# Patient Record
Sex: Female | Born: 1960 | Race: White | Hispanic: No | Marital: Married | Smoking: Former smoker
Health system: Southern US, Community
[De-identification: ages and names within clinical notes are randomized; demographics above are authoritative.]

## PROBLEM LIST (undated history)

## (undated) DIAGNOSIS — E079 Disorder of thyroid, unspecified: Secondary | ICD-10-CM

## (undated) DIAGNOSIS — Z87891 Personal history of nicotine dependence: Secondary | ICD-10-CM

## (undated) DIAGNOSIS — N959 Unspecified menopausal and perimenopausal disorder: Secondary | ICD-10-CM

## (undated) HISTORY — DX: Disorder of thyroid, unspecified: E07.9

## (undated) HISTORY — PX: TONSILLECTOMY AND ADENOIDECTOMY: SUR1326

## (undated) HISTORY — DX: Personal history of nicotine dependence: Z87.891

## (undated) HISTORY — PX: TUBAL LIGATION: SHX77

## (undated) HISTORY — DX: Unspecified menopausal and perimenopausal disorder: N95.9

## (undated) HISTORY — PX: CHOLECYSTECTOMY: SHX55

---

## 2004-12-23 ENCOUNTER — Ambulatory Visit: Payer: Self-pay | Admitting: Unknown Physician Specialty

## 2007-06-30 ENCOUNTER — Ambulatory Visit: Payer: Self-pay | Admitting: Emergency Medicine

## 2007-06-30 ENCOUNTER — Other Ambulatory Visit: Payer: Self-pay

## 2009-01-14 ENCOUNTER — Ambulatory Visit: Payer: Self-pay | Admitting: Endocrinology

## 2009-08-24 ENCOUNTER — Ambulatory Visit: Payer: Self-pay | Admitting: Unknown Physician Specialty

## 2011-07-31 ENCOUNTER — Emergency Department: Payer: Self-pay | Admitting: Emergency Medicine

## 2011-07-31 LAB — CBC
HCT: 41.6 % (ref 35.0–47.0)
MCH: 30.6 pg (ref 26.0–34.0)
MCHC: 33.5 g/dL (ref 32.0–36.0)
MCV: 91 fL (ref 80–100)
Platelet: 218 10*3/uL (ref 150–440)
RBC: 4.56 10*6/uL (ref 3.80–5.20)
RDW: 13.6 % (ref 11.5–14.5)
WBC: 5.4 10*3/uL (ref 3.6–11.0)

## 2011-07-31 LAB — CK TOTAL AND CKMB (NOT AT ARMC)
CK, Total: 40 U/L (ref 21–215)
CK-MB: <0.5 ng/mL — ABNORMAL LOW (ref 0.5–3.6)

## 2011-07-31 LAB — COMPREHENSIVE METABOLIC PANEL
Albumin: 3.9 g/dL (ref 3.4–5.0)
Alkaline Phosphatase: 67 U/L (ref 50–136)
BUN: 9 mg/dL (ref 7–18)
Bilirubin,Total: 0.4 mg/dL (ref 0.2–1.0)
Calcium, Total: 9 mg/dL (ref 8.5–10.1)
Co2: 27 mmol/L (ref 21–32)
Glucose: 98 mg/dL (ref 65–99)
Potassium: 3.7 mmol/L (ref 3.5–5.1)
SGOT(AST): 24 U/L (ref 15–37)
SGPT (ALT): 23 U/L
Sodium: 142 mmol/L (ref 136–145)
Total Protein: 7.3 g/dL (ref 6.4–8.2)

## 2011-07-31 LAB — TSH: Thyroid Stimulating Horm: 2.93 u[IU]/mL

## 2011-08-14 ENCOUNTER — Ambulatory Visit (INDEPENDENT_AMBULATORY_CARE_PROVIDER_SITE_OTHER): Payer: BC Managed Care – PPO | Admitting: Cardiovascular Disease

## 2011-08-14 ENCOUNTER — Encounter: Payer: Self-pay | Admitting: Cardiovascular Disease

## 2011-08-14 VITALS — BP 120/80 | HR 76 | Resp 16 | Ht 64.0 in | Wt 147.0 lb

## 2011-08-14 DIAGNOSIS — R42 Dizziness and giddiness: Secondary | ICD-10-CM | POA: Insufficient documentation

## 2011-08-14 NOTE — Assessment & Plan Note (Signed)
Etiology of the episodes of lightheadedness is uncertain. She does appreciate palpitations/tachycardia. We have ordered a Holter/event monitor to rule out underlying arrhythmia. We have asked her to monitor her blood pressure closely. Unable to exclude brief periods of orthostatic hypotension. His blood pressure is low, we have encouraged her to increase her salt and fluid intake. Lab work from the hospital did not suggest low blood glucose.   She would like to workout at the gym and we have suggested if her symptoms get worse with exertion, that she call us for a treadmill study to rule out ischemia.

## 2011-08-14 NOTE — Patient Instructions (Signed)
You are doing well. No medication changes were made. We have ordered a holter/event monitor to evaluate your lightheaded episodes.  Please call us if you have new issues that need to be addressed before your next appt.  Your physician wants you to follow-up in: 1 months.  We will call you with the results.

## 2011-08-14 NOTE — Progress Notes (Signed)
   Patient ID: Brittany Glover, female    DOB: Dec 14, 1960, 51 y.o.   MRN: [id number]  HPI Comments: Brittany Glover is a very pleasant 51 year old woman with a history of premenopausal symptoms, vitamin D deficiency, remote smoking history for 25 years one pack per day, who presents for evaluation of recent episodes of lightheadedness.   She reports that approximately 2 months ago she started having symptoms of lightheadedness while sitting and standing. No symptoms when lying flat. She reports having rapid palpitations when she felt her pulse in her neck during these episodes. Symptoms lasted several seconds, less than 1 minute. She had one severe episode after getting out of the shower with lightheadedness and nausea. She has had several episodes per week. She reports that it feels like her sugars are dropping low if symptoms do not seem to improve with eating. She has been gaining weight recently and would like to go to the gym.   She sleeps poorly on a regular basis. She is able to get to sleep and not able to stay asleep No new medications over the past several months.  She went to the hospital over 25th which showed normal EKG with no significant ST or T wave changes.  Normal lab work including cardiac enzymes, TSH, CBC, BMP and LFTs   Outpatient Encounter Prescriptions as of 08/14/2011  Medication Sig Dispense Refill  . cholecalciferol (VITAMIN D) 1000 UNITS tablet Take 2,000 Units by mouth daily.      . Multiple Vitamin (MULITIVITAMIN WITH MINERALS) TABS Take 1 tablet by mouth daily.      . progesterone (PROMETRIUM) 100 MG capsule Take 150 mg by mouth daily.        Review of Systems  Constitutional: Negative.   HENT: Negative.   Eyes: Negative.   Respiratory: Negative.   Cardiovascular: Negative.   Gastrointestinal: Negative.   Musculoskeletal: Negative.   Skin: Negative.   Neurological: Positive for dizziness and light-headedness.  Hematological: Negative.   Psychiatric/Behavioral:  Negative.   All other systems reviewed and are negative.    BP 120/80  Pulse 76  Resp 16  Wt 147 lb (66.679 kg)  Physical Exam  Nursing note and vitals reviewed. Constitutional: She is oriented to person, place, and time. She appears well-developed and well-nourished.  HENT:  Head: Normocephalic.  Nose: Nose normal.  Mouth/Throat: Oropharynx is clear and moist.  Eyes: Conjunctivae are normal. Pupils are equal, round, and reactive to light.  Neck: Normal range of motion. Neck supple. No JVD present.  Cardiovascular: Normal rate, regular rhythm, S1 normal, S2 normal, normal heart sounds and intact distal pulses.  Exam reveals no gallop and no friction rub.   No murmur heard. Pulmonary/Chest: Effort normal and breath sounds normal. No respiratory distress. She has no wheezes. She has no rales. She exhibits no tenderness.  Abdominal: Soft. Bowel sounds are normal. She exhibits no distension. There is no tenderness.  Musculoskeletal: Normal range of motion. She exhibits no edema and no tenderness.  Lymphadenopathy:    She has no cervical adenopathy.  Neurological: She is alert and oriented to person, place, and time. Coordination normal.  Skin: Skin is warm and dry. No rash noted. No erythema.  Psychiatric: She has a normal mood and affect. Her behavior is normal. Judgment and thought content normal.         Assessment and Plan

## 2011-09-14 ENCOUNTER — Ambulatory Visit (INDEPENDENT_AMBULATORY_CARE_PROVIDER_SITE_OTHER): Payer: BC Managed Care – PPO | Admitting: Cardiovascular Disease

## 2011-09-14 ENCOUNTER — Encounter: Payer: Self-pay | Admitting: Cardiovascular Disease

## 2011-09-14 VITALS — BP 90/64 | HR 76 | Ht 64.0 in | Wt 146.5 lb

## 2011-09-14 DIAGNOSIS — R42 Dizziness and giddiness: Secondary | ICD-10-CM

## 2011-09-14 NOTE — Patient Instructions (Signed)
You are doing well. No medication changes were made.  Please call us if you have new issues that need to be addressed before your next appt.  Your physician wants you to follow-up in: 12 months.  You will receive a reminder letter in the mail two months in advance. If you don't receive a letter, please call our office to schedule the follow-up appointment. 

## 2011-09-14 NOTE — Assessment & Plan Note (Signed)
She does not believe her symptoms are from low blood pressure, rather she feels her symptoms are from high blood pressure. Most of her blood pressure readings are well controlled. He then monitor does not show arrhythmia. No further workup at this time. She does not need medications for hypertension given most of her numbers are well controlled. We will continue to monitor her symptoms for now.

## 2011-09-14 NOTE — Progress Notes (Signed)
   Patient ID: Brittany Glover, female    DOB: [birth year], 51 y.o.   MRN: [id number]  HPI Comments: Brittany Glover is a very pleasant 51 year old woman with a history of premenopausal symptoms, vitamin D deficiency, remote smoking history for 25 years one pack per day, who presents for followup of recent episodes of lightheadedness.   On her previous office visit, she had symptoms of lightheadedness while sitting and standing. No symptoms when lying flat. She reports having rapid palpitations when she felt her pulse in her neck during these episodes.  The event monitor was ordered. She has worn this almost 4 weeks with no significant arrhythmia noted. She does have long list of her blood pressure and heart rate numbers. Blood pressure is generally well controlled with occasional systolic pressure in the 90s. She reports that she is asymptomatic with systolic pressure in the 90s and she is more worried about one day when she had malaise and dizziness with systolic pressure of 160. This was brief, and rare. She has had no recent episodes of lightheadedness or dizziness. Less palpitations as well.  Normal lab work including cardiac enzymes, TSH, CBC, BMP and LFTs at [facility]    Outpatient Encounter Prescriptions as of 09/14/2011  Medication Sig Dispense Refill  . cholecalciferol (VITAMIN D) 1000 UNITS tablet Take 2,000 Units by mouth daily.      Marland Kitchen levothyroxine (SYNTHROID, LEVOTHROID) 88 MCG tablet Take 88 mcg alternate with 75 mcg on the third day.      . Multiple Vitamin (MULITIVITAMIN WITH MINERALS) TABS Take 1 tablet by mouth daily.      . progesterone (PROMETRIUM) 100 MG capsule Take 150 mg by mouth daily.        Review of Systems  Constitutional: Negative.   HENT: Negative.   Eyes: Negative.   Respiratory: Negative.   Cardiovascular: Negative.   Gastrointestinal: Negative.   Musculoskeletal: Negative.   Skin: Negative.   Hematological: Negative.   Psychiatric/Behavioral: Negative.   All other  systems reviewed and are negative.    BP 90/64  Pulse 76  Ht 5\' 4"  (1.626 m)  Wt 146 lb 8 oz (66.452 kg)  BMI 25.15 kg/m2  Physical Exam  Nursing note and vitals reviewed. Constitutional: She is oriented to person, place, and time. She appears well-developed and well-nourished.  HENT:  Head: Normocephalic.  Nose: Nose normal.  Mouth/Throat: Oropharynx is clear and moist.  Eyes: Conjunctivae are normal. Pupils are equal, round, and reactive to light.  Neck: Normal range of motion. Neck supple. No JVD present.  Cardiovascular: Normal rate, regular rhythm, S1 normal, S2 normal, normal heart sounds and intact distal pulses.  Exam reveals no gallop and no friction rub.   No murmur heard. Pulmonary/Chest: Effort normal and breath sounds normal. No respiratory distress. She has no wheezes. She has no rales. She exhibits no tenderness.  Abdominal: Soft. Bowel sounds are normal. She exhibits no distension. There is no tenderness.  Musculoskeletal: Normal range of motion. She exhibits no edema and no tenderness.  Lymphadenopathy:    She has no cervical adenopathy.  Neurological: She is alert and oriented to person, place, and time. Coordination normal.  Skin: Skin is warm and dry. No rash noted. No erythema.  Psychiatric: She has a normal mood and affect. Her behavior is normal. Judgment and thought content normal.         Assessment and Plan

## 2011-10-04 ENCOUNTER — Encounter: Payer: Self-pay | Admitting: Cardiovascular Disease

## 2011-10-19 ENCOUNTER — Emergency Department: Payer: Self-pay | Admitting: Emergency Medicine

## 2011-10-19 LAB — COMPREHENSIVE METABOLIC PANEL
Albumin: 3.9 g/dL (ref 3.4–5.0)
Alkaline Phosphatase: 73 U/L (ref 50–136)
Anion Gap: 7 (ref 7–16)
Bilirubin,Total: 0.3 mg/dL (ref 0.2–1.0)
Chloride: 108 mmol/L — ABNORMAL HIGH (ref 98–107)
Co2: 27 mmol/L (ref 21–32)
EGFR (African American): >60
Glucose: 96 mg/dL (ref 65–99)
Potassium: 3.8 mmol/L (ref 3.5–5.1)
SGOT(AST): 28 U/L (ref 15–37)
Sodium: 142 mmol/L (ref 136–145)

## 2011-10-19 LAB — TROPONIN I: Troponin-I: <0.02 ng/mL

## 2012-08-22 ENCOUNTER — Ambulatory Visit: Payer: Self-pay

## 2013-05-19 IMAGING — CR DG CHEST 2V
1 series · 2 of 2 positions shown · non-contrast
Comparison: none

REASON FOR EXAM: chest pain.
COMMENTS:

[Series 1: w chest pa · 0.14mm/px · 2 of 2 slices shown]
[im 1/2]
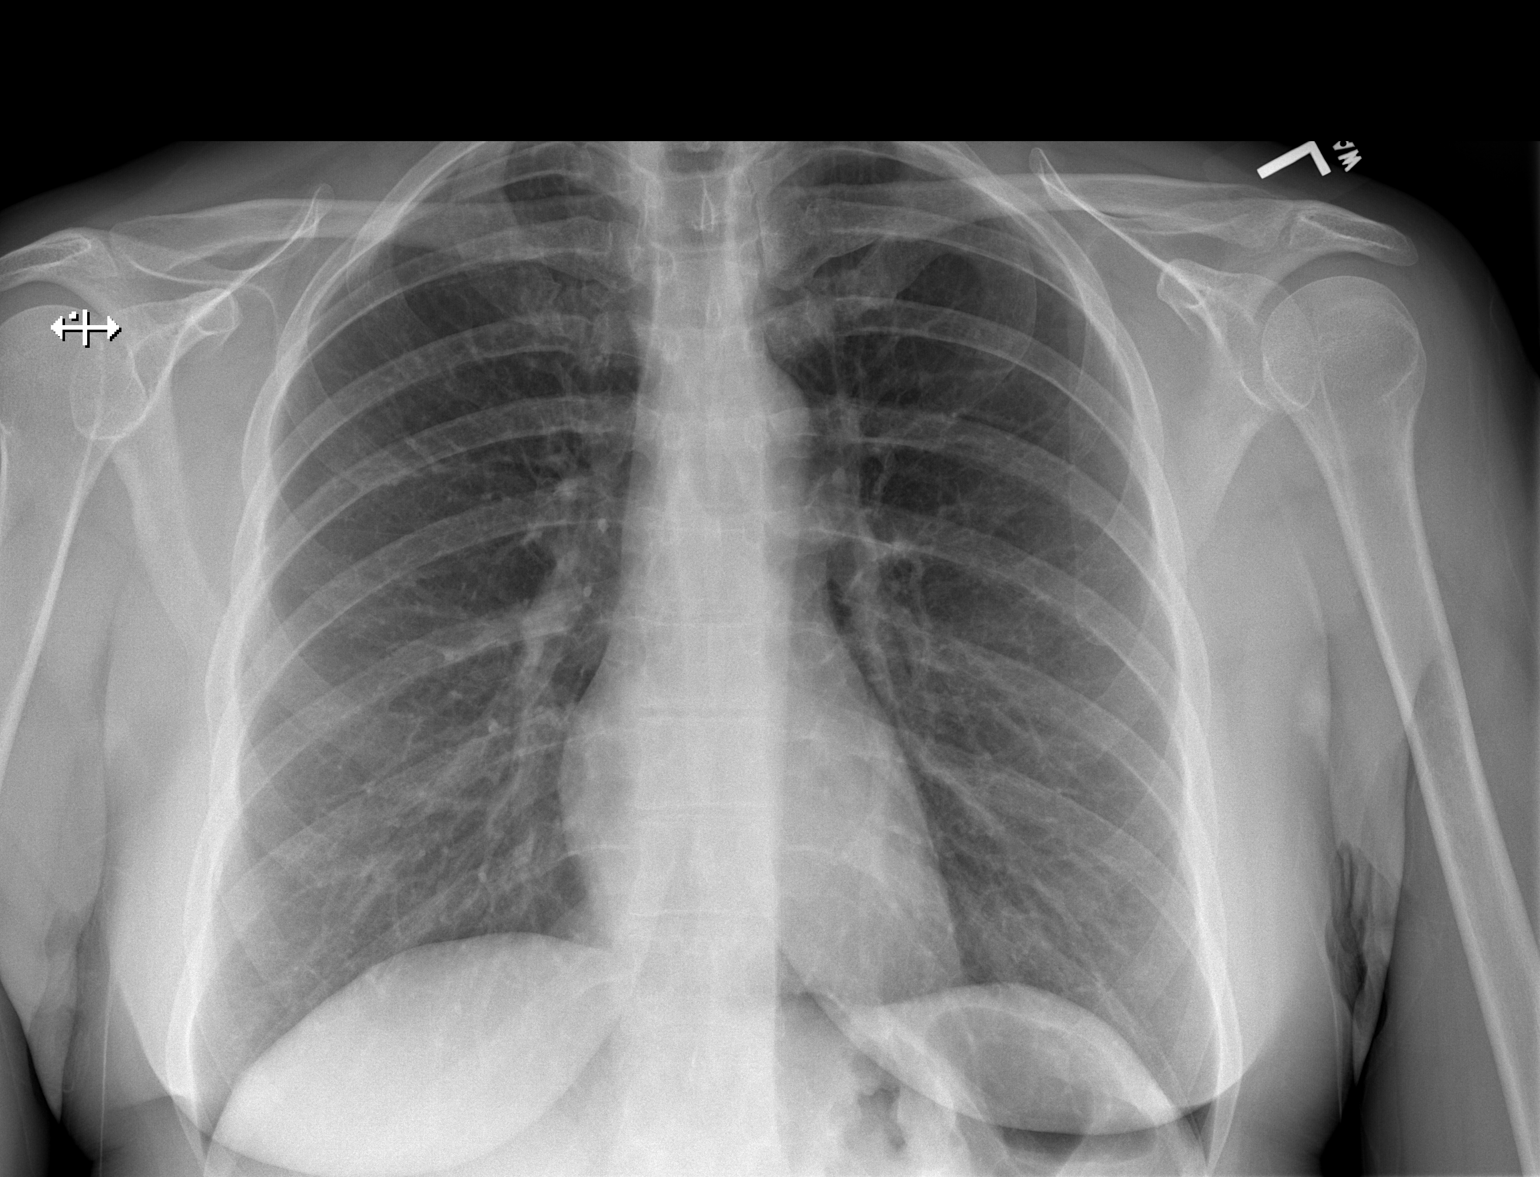
[im 2/2]
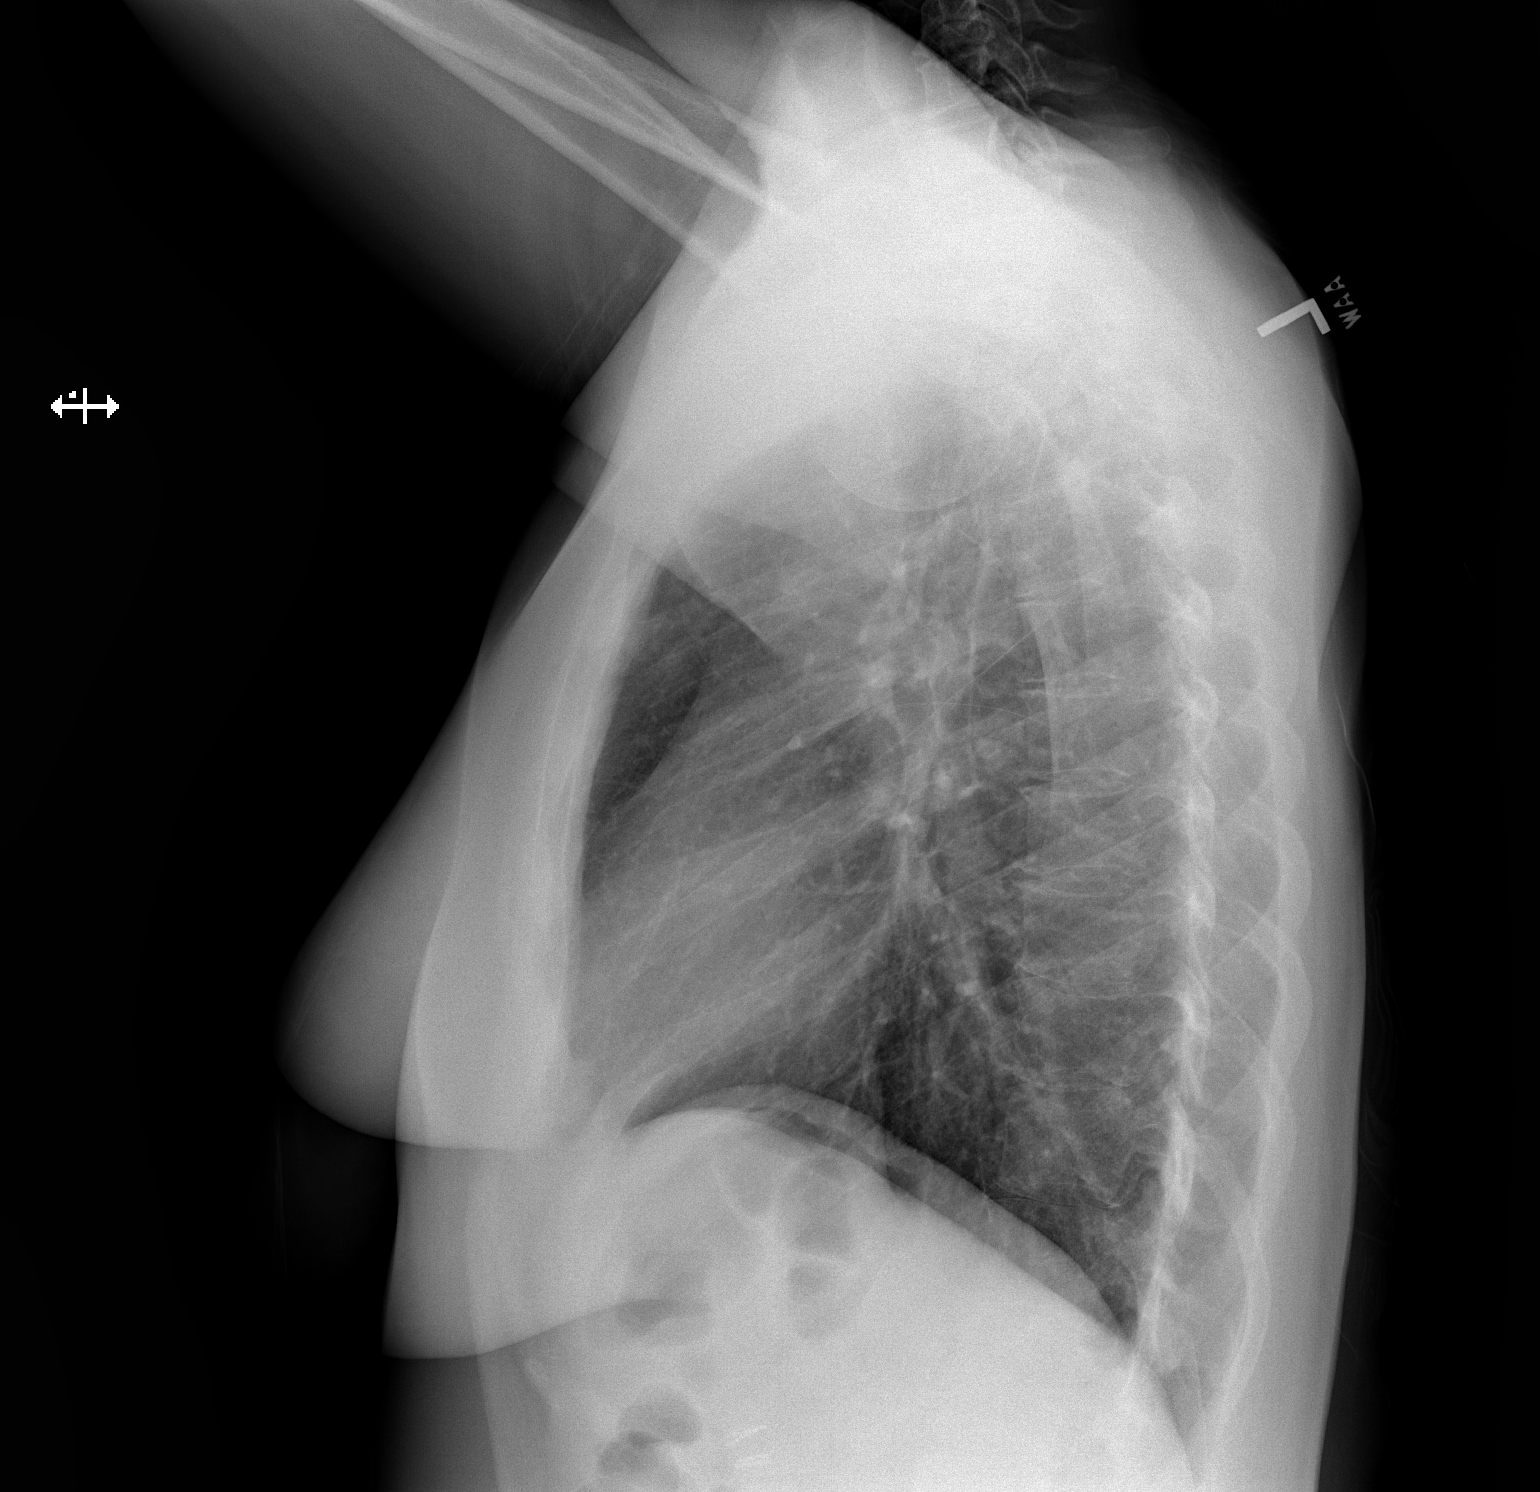

[2 of 2 positions shown; findings below may reference images not displayed]

PROCEDURE:     DXR - DXR CHEST PA (OR AP) AND LATERAL  - October 19, 2011  [DATE]

RESULT:     The lungs are adequately inflated. There is no focal infiltrate.
There is no evidence of pneumothorax or pneumomediastinum or pleural
effusion. The cardiac silhouette is normal in size. The pulmonary
vascularity is not engorged. The bony thorax exhibits no acute abnormality.
IMPRESSION: I do not see evidence of acute cardiopulmonary abnormality.

## 2013-05-19 IMAGING — US US EXTREM LOW VENOUS BILAT
1 series · 14 of 24 positions shown · non-contrast
Comparison: none

REASON FOR EXAM: swelling bil legs
COMMENTS:

[Series 1: us extrem low venous bilat · 0.06mm/px · 14 of 44 slices shown]
[im 1/44]
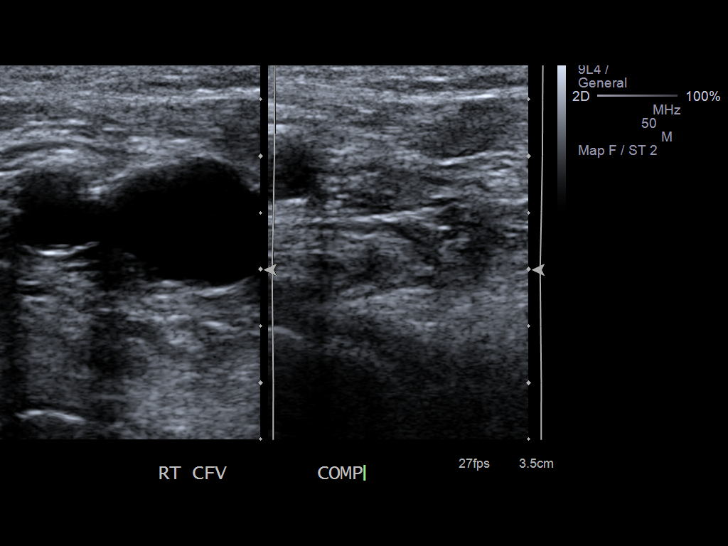
[im 4/44]
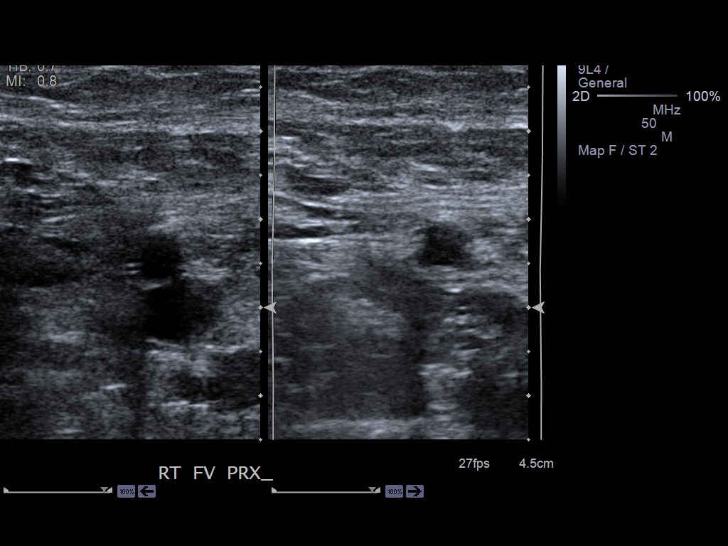
[im 8/44]
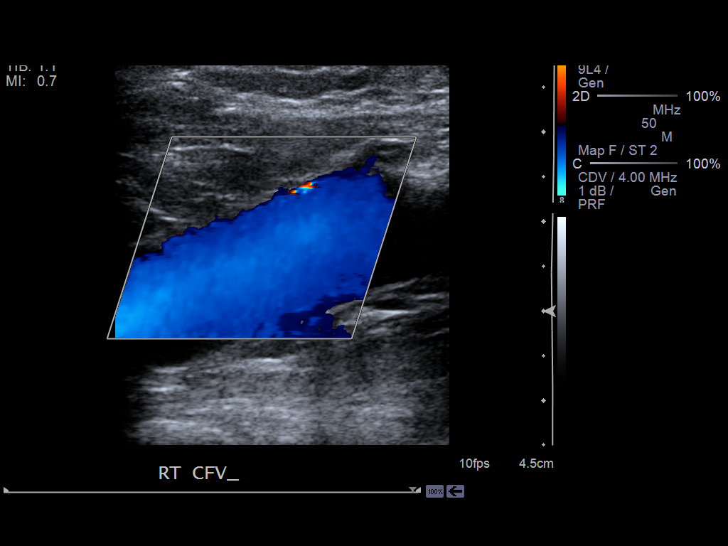
[im 12/44]
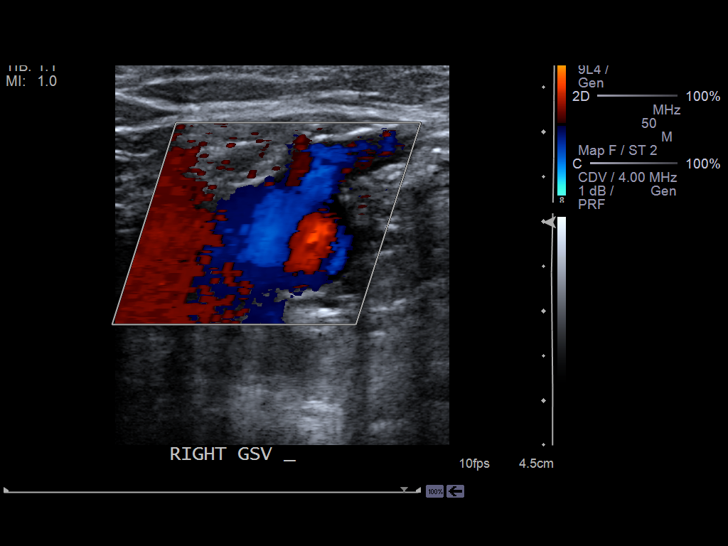
[im 14/44]
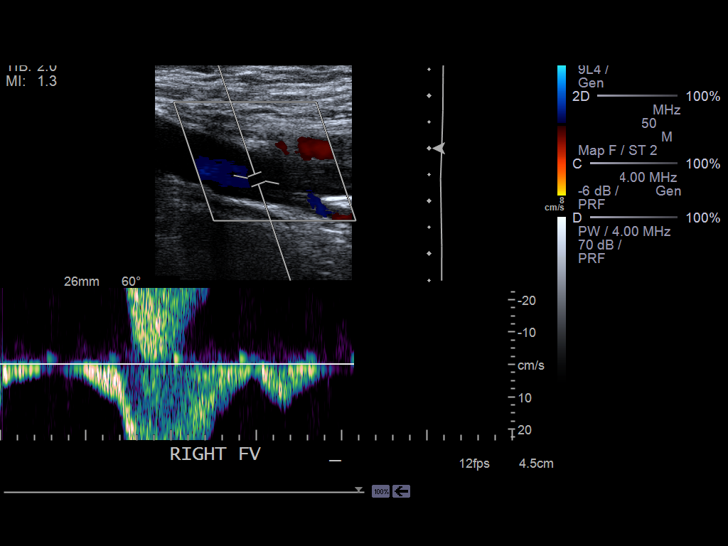
[im 17/44]
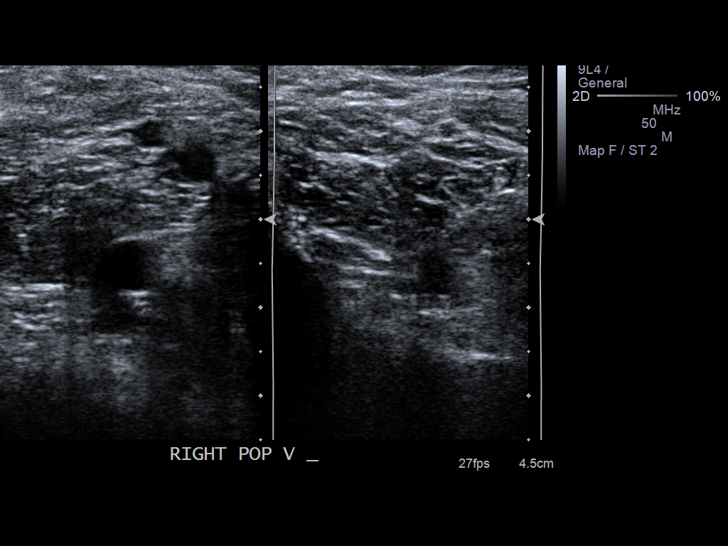
[im 21/44]
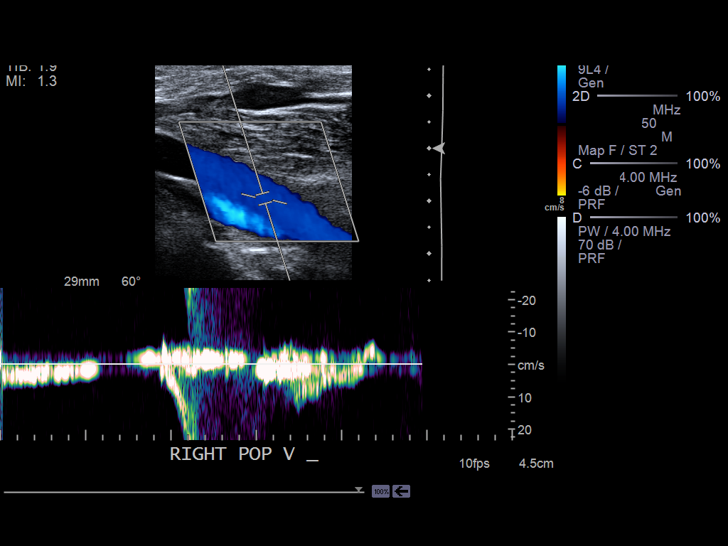
[im 23/44]
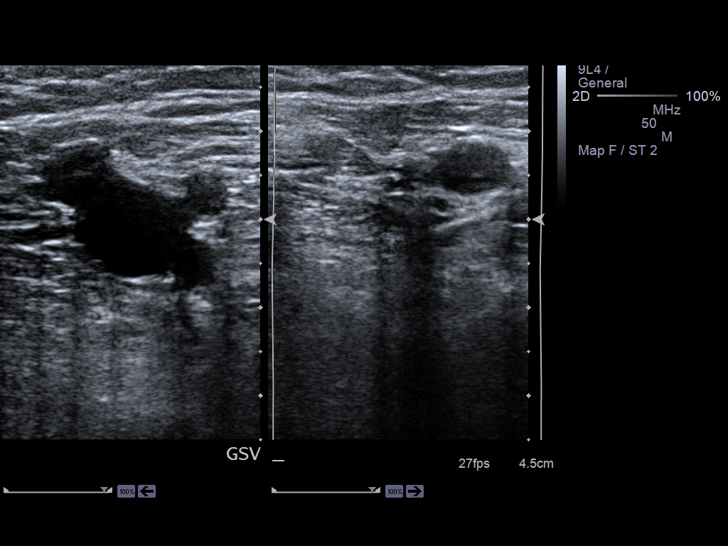
[im 27/44]
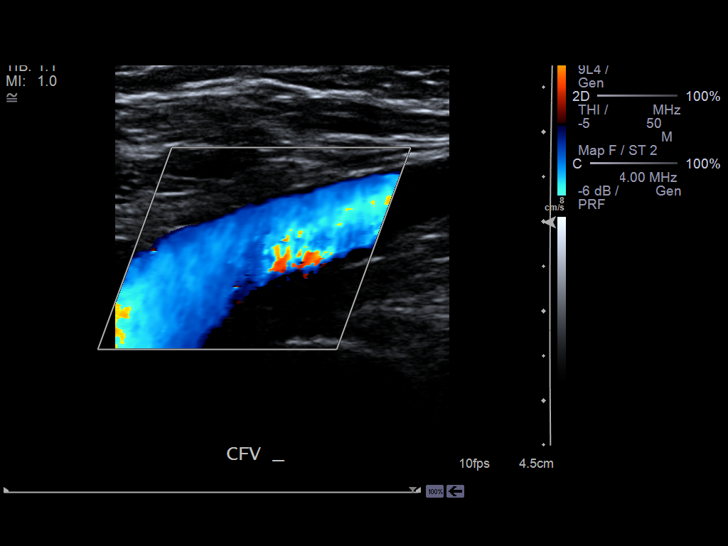
[im 30/44]
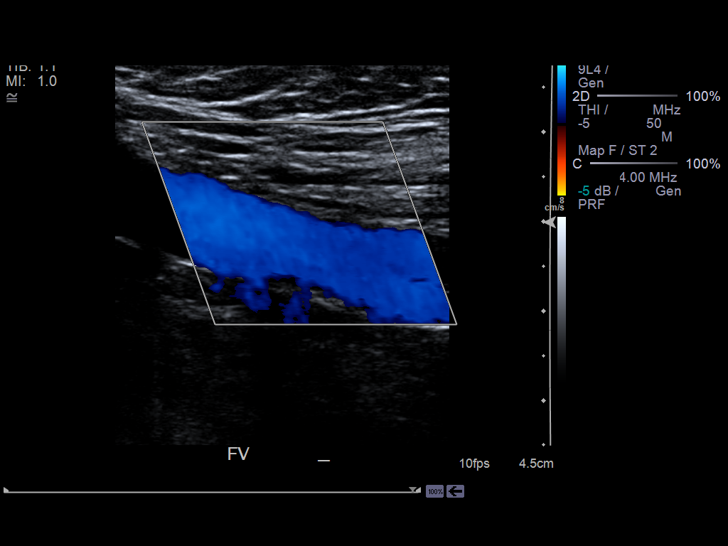
[im 34/44]
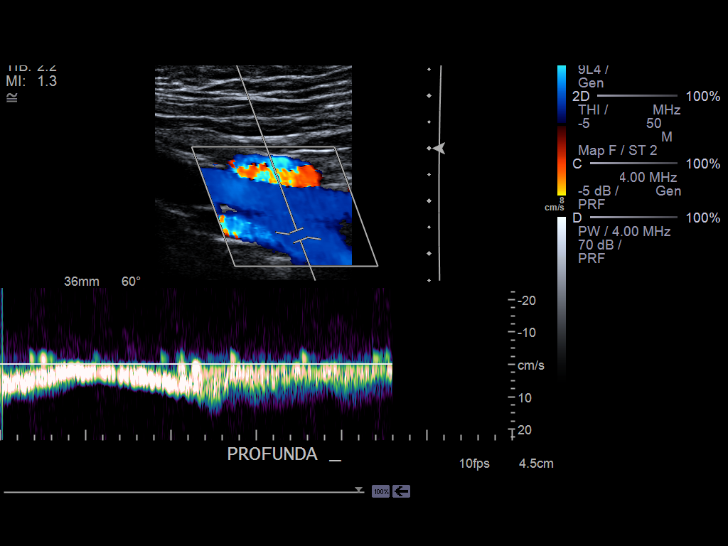
[im 36/44]
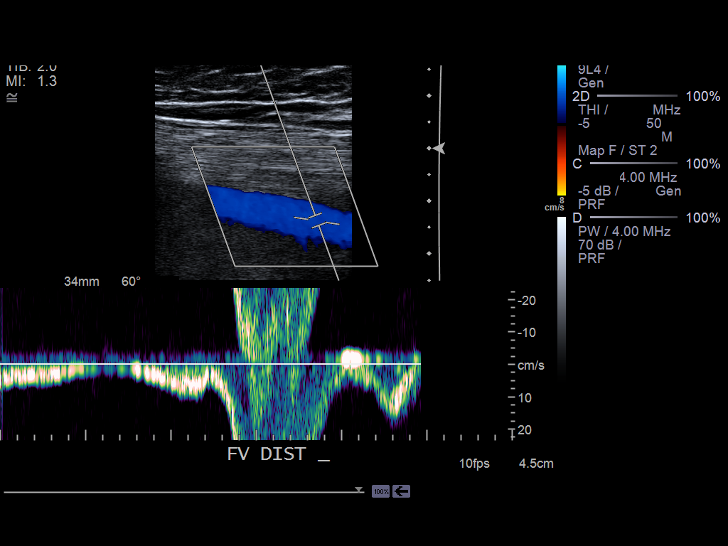
[im 40/44]
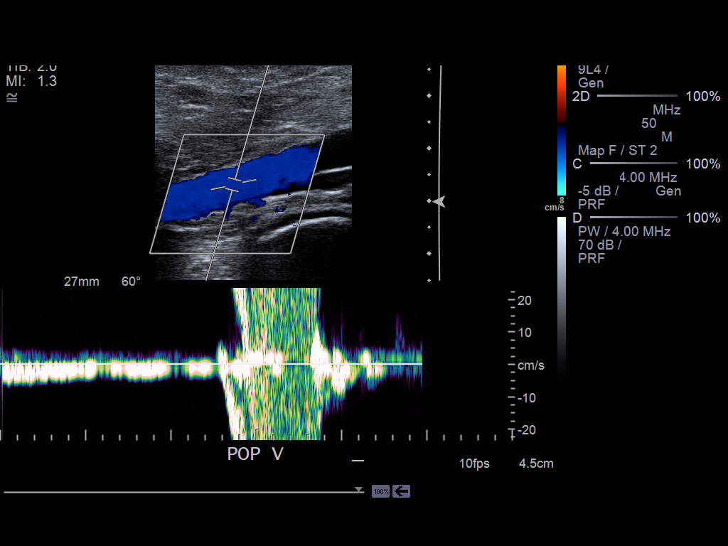
[im 44/44]
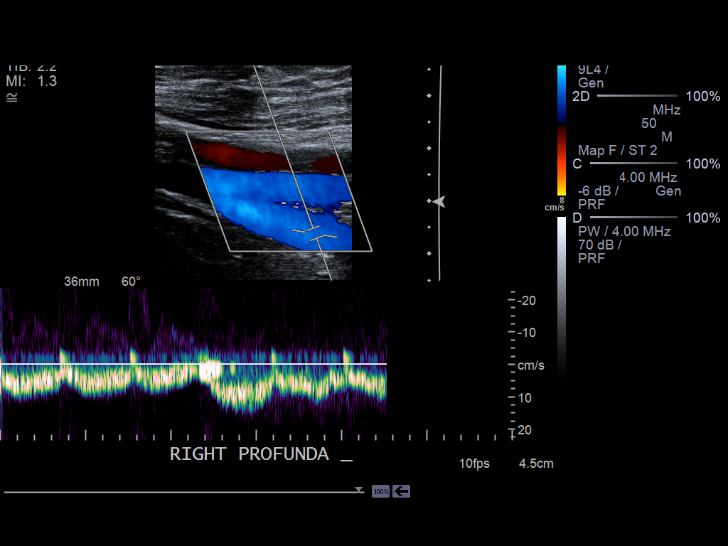

[14 of 24 positions shown; findings below may reference images not displayed]

PROCEDURE:     US  - US DOPPLER LOW EXTR BILATERAL  - October 19, 2011  [DATE]

RESULT:     The right and left femoral and popliteal veins are normally
compressible. The waveform patterns are normal and the color flow images are
normal. The response to the augmentation and Valsalva maneuvers is normal.
IMPRESSION: There is no evidence of thrombus within the right or left
femoral or popliteal veins.

[REDACTED]

## 2013-07-16 ENCOUNTER — Ambulatory Visit: Payer: Self-pay | Admitting: Obstetrics and Gynecology

## 2013-07-22 ENCOUNTER — Ambulatory Visit: Payer: Self-pay | Admitting: Obstetrics and Gynecology

## 2013-07-23 LAB — PATHOLOGY REPORT

## 2014-05-31 DIAGNOSIS — E039 Hypothyroidism, unspecified: Secondary | ICD-10-CM | POA: Insufficient documentation

## 2014-09-26 NOTE — Op Note (Signed)
PATIENT NAME:  Brittany Glover, Lavene J MR#:  [id number] DATE OF BIRTH:  [birth year]  DATE OF PROCEDURE:  07/22/2013  PREOPERATIVE DIAGNOSIS:  Postmenopausal bleeding.   POSTOPERATIVE DIAGNOSIS:  Postmenopausal bleeding including cervical stenosis.   OPERATION PERFORMED: Hysteroscopy and targeted curettage using the MyoSure system.   ANESTHESIA USED: General.   PRIMARY SURGEON: Dr. Vena AustriaAndreas Selina Tapper.   ESTIMATED BLOOD LOSS: Minimal.   COMPLICATIONS:  None.    FINDINGS: Cervical stenosis, hypoplastic endometrium with 1 endometrial polyp. Thin, grapelike clusters were noted protruding into the cervical os from the polyp.   SPECIMENS REMOVED: Endometrial curettings.   CONDITION FOLLOWING PROCEDURE: Stable.   PREOPERATIVE ANTIBIOTICS: None.   DRAINS OR TUBES: None.   IMPLANTS: None.   PROCEDURE IN DETAIL: Risks, benefits and alternatives of the procedure were discussed with the patient prior to proceeding to the operating room. The patient had been previously evaluated in the office for postmenopausal bleeding and was noted to have an endometrial stripe of 4.8 mm.  An in-office endometrial biopsy using a Pipelle had been unsuccessful secondary to cervical stenosis. The patient was, therefore, consented for operative evaluation of her endometrial cavity. The patient was taken to the operating room where she was placed under general anesthesia. She was positioned in the dorsal lithotomy position using Allen stirrups, prepped and draped in the usual sterile fashion. A timeout was performed. Attention was turned to the patient's pelvis. The bladder was straight cath'd with a red rubber catheter yielding 200 of mL of clear urine. An operative speculum was then placed. The anterior lip of the cervix was visualized, grasped with a single-tooth tenaculum, and the cervix was sequentially dilated with a Pratt dilator. There was some difficulty in the dilation secondary to some stenosis. The final portion of  dilation was accomplished under direct visualization using the hysteroscope. Upon placing the hysteroscope into the cervix, the cervix was grossly normal. There was a small intracervical polyp, which was also resected. Upon entering the cavity, the endometrial lining appeared thickened and fluffy with grapelike clusters, as well as what appeared to be an endometrial polyp in the left fundal portion of the cavity. Tubal ostia were visualized and were grossly normal. The cavity was curettaged using the MyoSure for 360 degrees. Following this, the cavity was reinspected and noted to be hemostatic. The MyoSure device was removed, as was the single-tooth tenaculum. Sponge, needle and instrument counts were correct x 2. The patient tolerated the procedure well and was taken to the recovery room in stable condition.    ____________________________ Florina OuAndreas M. Bonney AidStaebler, MD ams:dmm D: 07/22/2013 20:52:26 ET T: 07/22/2013 21:21:48 ET JOB#: [id number]  cc: Florina OuAndreas M. Bonney AidStaebler, MD, <Dictator> Carmel SacramentoANDREAS Cathrine MusterM Cheyan Frees MD ELECTRONICALLY SIGNED 07/23/2013 1:01

## 2014-10-15 ENCOUNTER — Other Ambulatory Visit: Payer: Self-pay | Admitting: Neurology

## 2014-10-15 DIAGNOSIS — S060X0A Concussion without loss of consciousness, initial encounter: Secondary | ICD-10-CM

## 2014-10-23 ENCOUNTER — Ambulatory Visit
Admission: RE | Admit: 2014-10-23 | Discharge: 2014-10-23 | Disposition: A | Discharge status: Home / Self Care | Payer: BC Managed Care – PPO | Source: Ambulatory Visit | Attending: Neurology | Admitting: Neurology

## 2014-10-23 DIAGNOSIS — M2578 Osteophyte, vertebrae: Secondary | ICD-10-CM | POA: Diagnosis not present

## 2014-10-23 DIAGNOSIS — M5021 Other cervical disc displacement,  high cervical region: Secondary | ICD-10-CM | POA: Diagnosis not present

## 2014-10-23 DIAGNOSIS — M5082 Other cervical disc disorders, mid-cervical region: Secondary | ICD-10-CM | POA: Insufficient documentation

## 2014-10-23 DIAGNOSIS — G542 Cervical root disorders, not elsewhere classified: Secondary | ICD-10-CM | POA: Diagnosis not present

## 2014-10-23 DIAGNOSIS — M5382 Other specified dorsopathies, cervical region: Secondary | ICD-10-CM | POA: Insufficient documentation

## 2014-10-23 DIAGNOSIS — S060X0A Concussion without loss of consciousness, initial encounter: Secondary | ICD-10-CM | POA: Diagnosis present

## 2014-10-23 DIAGNOSIS — M47812 Spondylosis without myelopathy or radiculopathy, cervical region: Secondary | ICD-10-CM | POA: Insufficient documentation

## 2014-10-23 DIAGNOSIS — M5030 Other cervical disc degeneration, unspecified cervical region: Secondary | ICD-10-CM | POA: Diagnosis present

## 2014-10-23 MED ORDER — GADOBENATE DIMEGLUMINE 529 MG/ML IV SOLN
15.0000 mL | Freq: Once | INTRAVENOUS | Status: AC | PRN
  Administered 2014-10-23: 15 mL via INTRAVENOUS

## 2014-11-04 ENCOUNTER — Encounter: Payer: Self-pay | Admitting: Family Medicine

## 2014-11-04 ENCOUNTER — Ambulatory Visit (INDEPENDENT_AMBULATORY_CARE_PROVIDER_SITE_OTHER): Payer: BC Managed Care – PPO | Admitting: Family Medicine

## 2014-11-04 VITALS — BP 112/78 | HR 84 | Temp 98.7°F | Resp 16 | Ht 64.0 in | Wt 154.0 lb

## 2014-11-04 DIAGNOSIS — S060X0D Concussion without loss of consciousness, subsequent encounter: Secondary | ICD-10-CM

## 2014-11-04 NOTE — Progress Notes (Signed)
   Subjective:    Patient ID: Brittany Glover, female    DOB: 10/18/1960, 54 y.o.   MRN: [id number]  HPI Comments: This is a FU from a fall. Pt was diagnosed with concussion. Pt needs to be released to go back to work.  Fall The accident occurred more than 1 week ago (May 4). The fall occurred while walking (slipped in the kitchen). She landed on hard floor. There was no blood loss. The point of impact was the head (and back). The pain is at a severity of 0/10. The patient is experiencing no pain. Pertinent negatives include no abdominal pain, bowel incontinence, fever, headaches, hearing loss, hematuria, loss of consciousness, nausea, numbness, tingling, visual change or vomiting. She has tried NSAID for the symptoms. The treatment provided mild relief.     Did see neurology.  Wants to try to go back to work part time. Does develop symptoms if does too much.    Review of Systems  Constitutional: Negative for fever.  Gastrointestinal: Negative for nausea, vomiting, abdominal pain and bowel incontinence.  Genitourinary: Negative for hematuria.  Neurological: Negative for tingling, loss of consciousness, numbness and headaches.   Patient Active Problem List   Diagnosis Date Noted  . Acquired hypothyroidism 05/31/2014  . Episodic lightheadedness 08/14/2011   No Known Allergies   Current Outpatient Prescriptions on File Prior to Visit  Medication Sig Dispense Refill  . levothyroxine (SYNTHROID, LEVOTHROID) 88 MCG tablet Take 88 mcg alternate with 75 mcg on the third day.    . progesterone (PROMETRIUM) 100 MG capsule Take 150 mg by mouth daily.     No current facility-administered medications on file prior to visit.    Soc- Former smoker, married, here with husband today.               Objective:   Physical Exam  Constitutional: She is oriented to person, place, and time. She appears well-developed and well-nourished.  Cardiovascular: Normal rate and regular rhythm.    Neurological: She is alert and oriented to person, place, and time.  Psychiatric: She has a normal mood and affect. Her behavior is normal. Judgment and thought content normal.    BP 112/78 mmHg  Pulse 84  Temp(Src) 98.7 F (37.1 C) (Oral)  Resp 16  Ht 5\' 4"  (1.626 m)  Wt 154 lb (69.854 kg)  BMI 26.42 kg/m2       Assessment & Plan:    1. Concussion, without loss of consciousness, subsequent encounter Improving. Will return to work part time and see how she does.  Increase as tolerated.  Filled out FMLA form today.

## 2014-12-10 ENCOUNTER — Ambulatory Visit (INDEPENDENT_AMBULATORY_CARE_PROVIDER_SITE_OTHER): Payer: BC Managed Care – PPO | Admitting: Family Medicine

## 2014-12-10 ENCOUNTER — Encounter: Payer: Self-pay | Admitting: Family Medicine

## 2014-12-10 VITALS — BP 122/74 | HR 64 | Temp 98.1°F | Resp 16 | Ht 64.0 in | Wt 161.0 lb

## 2014-12-10 DIAGNOSIS — R635 Abnormal weight gain: Secondary | ICD-10-CM | POA: Diagnosis not present

## 2014-12-10 DIAGNOSIS — F419 Anxiety disorder, unspecified: Secondary | ICD-10-CM | POA: Diagnosis not present

## 2014-12-10 DIAGNOSIS — R42 Dizziness and giddiness: Secondary | ICD-10-CM | POA: Diagnosis not present

## 2014-12-10 NOTE — Progress Notes (Signed)
Subjective:    Patient ID: Brittany Glover, female    DOB: [birth year], 54 y.o.   MRN: [id number]  Anxiety Presents for follow-up visit. The problem has been unchanged (Pt is doing well in Lexapro, but she is concerned about her recent weight gain). Symptoms include nausea. Patient reports no chest pain, confusion, decreased concentration, dizziness, nervous/anxious behavior, palpitations, shortness of breath or suicidal ideas. The quality of sleep is poor.   The treatment provided moderate relief. Compliance with prior treatments has been good.  Sinusitis This is a new problem. The current episode started more than 1 month ago. The problem has been waxing and waning since onset. There has been no fever. The pain is mild (Has a dull headache.). Associated symptoms include chills (Started today.), congestion, headaches, sinus pressure and sneezing. Pertinent negatives include no coughing, diaphoresis, ear pain, neck pain, shortness of breath or sore throat. Past treatments include oral decongestants.    Patient Active Problem List   Diagnosis Date Noted  . Acquired hypothyroidism 05/31/2014  . Episodic lightheadedness 08/14/2011   Family History  Problem Relation Age of Onset  . Heart attack Father   . Irregular heart beat Father     Had a pacemaker  . Healthy Mother    History   Social History  . Marital Status: Married    Spouse Name: N/A  . Number of Children: 2  . Years of Education: N/A   Occupational History  .      Full Time   Social History Main Topics  . Smoking status: Former Smoker -- 0.50 packs/day for 20 years    Types: Cigarettes    Quit date: 10/13/2004  . Smokeless tobacco: Never Used  . Alcohol Use: No  . Drug Use: No  . Sexual Activity: Not on file   Other Topics Concern  . Not on file   Social History Narrative   Past Surgical History  Procedure Laterality Date  . Tubal ligation    . Cholecystectomy    . Tonsillectomy and adenoidectomy  pt was  6   No Known Allergies Previous Medications   ESCITALOPRAM (LEXAPRO) 10 MG TABLET       LEVOTHYROXINE (SYNTHROID, LEVOTHROID) 88 MCG TABLET    88 mcg daily before breakfast.    LORAZEPAM (ATIVAN) 0.5 MG TABLET       PROGESTERONE (PROMETRIUM) 100 MG CAPSULE    Take 150 mg by mouth daily.   BP 122/74 mmHg  Pulse 64  Temp(Src) 98.1 F (36.7 C) (Oral)  Resp 16  Ht 5\' 4"  (1.626 m)  Wt 161 lb (73.029 kg)  BMI 27.62 kg/m2  Review of Systems  Constitutional: Positive for chills (Started today.), fatigue and unexpected weight change (Pt has gain about 11 pounds since 07/2014, when she started Lexapro.). Negative for fever, diaphoresis, activity change and appetite change.  HENT: Positive for congestion, nosebleeds (Last week.), postnasal drip, sinus pressure, sneezing and tinnitus (Chronic Issue). Negative for ear discharge, ear pain, facial swelling, hearing loss, mouth sores, sore throat, trouble swallowing and voice change.   Respiratory: Negative for apnea, cough, choking, chest tightness, shortness of breath, wheezing and stridor.   Cardiovascular: Negative for chest pain, palpitations and leg swelling.  Gastrointestinal: Positive for nausea and vomiting (Started today.). Negative for abdominal pain, diarrhea, constipation, blood in stool, abdominal distention, anal bleeding and rectal pain.  Endocrine: Positive for heat intolerance and polydipsia. Negative for cold intolerance, polyphagia and polyuria.  Musculoskeletal: Positive for arthralgias. Negative for myalgias,  back pain, joint swelling, gait problem, neck pain and neck stiffness.  Neurological: Positive for light-headedness (Especially today.) and headaches. Negative for dizziness, tremors, seizures, syncope, facial asymmetry, speech difficulty, weakness and numbness.  Psychiatric/Behavioral: Positive for sleep disturbance. Negative for suicidal ideas, hallucinations, behavioral problems, confusion, self-injury, dysphoric mood,  decreased concentration and agitation. The patient is not nervous/anxious and is not hyperactive.        Objective:   Physical Exam  Constitutional: She is oriented to person, place, and time. She appears well-developed and well-nourished.  Cardiovascular: Normal rate and regular rhythm.   Pulmonary/Chest: Effort normal and breath sounds normal.  Neurological: She is alert and oriented to person, place, and time.  Psychiatric: She has a normal mood and affect. Her behavior is normal. Judgment and thought content normal.   BP 122/74 mmHg  Pulse 64  Temp(Src) 98.1 F (36.7 C) (Oral)  Resp 16  Ht  (1.626 m)  Wt 161 lb (73.029 kg)  BMI 27.62 kg/m2      Assessment & Plan:  1. Weight gain Will taper off medication for anxiety.  Continue to monitor.    2. Anxiety Think it has  improved.    3. Dizzy spells Unclear etiology.  Will continue to monitor.   Lorie Phenix, MD

## 2015-01-07 ENCOUNTER — Encounter: Payer: Self-pay | Admitting: Family Medicine

## 2015-01-07 ENCOUNTER — Ambulatory Visit (INDEPENDENT_AMBULATORY_CARE_PROVIDER_SITE_OTHER): Payer: BC Managed Care – PPO | Admitting: Family Medicine

## 2015-01-07 VITALS — BP 102/70 | HR 82 | Temp 98.5°F | Resp 16 | Wt 165.2 lb

## 2015-01-07 DIAGNOSIS — R42 Dizziness and giddiness: Secondary | ICD-10-CM | POA: Diagnosis not present

## 2015-01-07 DIAGNOSIS — E039 Hypothyroidism, unspecified: Secondary | ICD-10-CM | POA: Diagnosis not present

## 2015-01-07 DIAGNOSIS — F419 Anxiety disorder, unspecified: Secondary | ICD-10-CM | POA: Diagnosis not present

## 2015-01-07 NOTE — Progress Notes (Signed)
Patient ID: Brittany Glover, female   DOB: 03-14-1961, 54 y.o.   MRN: [id number]       Patient: Brittany Glover Female    DOB: 07-08-1960   54 y.o.   MRN: [id number] Visit Date: 01/07/2015  Today's Provider: Lorie Phenix, MD   Chief Complaint  Patient presents with  . Dizziness    follow-up, " they are better" pt stated  . Anxiety    weaned down on the medication, have not taken any since last thrusday.   . Weight Check    weight gain   Subjective:    Dizziness Chronicity: No more dizziness. Associated symptoms include fatigue. Pertinent negatives include no chills, congestion, coughing, sore throat, visual change or weakness.  Anxiety Presents for follow-up visit. Symptoms include insomnia and restlessness. Patient reports no dizziness, muscle tension, nervous/anxious behavior, palpitations or shortness of breath. Symptoms occur constantly (restlessness, trying OTC, melanoma , Zqual, not helping for insomnia.). Hours of sleep per night: never get into the deep sleep. The quality of sleep is poor.        No Known Allergies Previous Medications   ESCITALOPRAM (LEXAPRO) 10 MG TABLET    Take 10 mg by mouth daily. 1/2 a day for 3 weeks and then stop   LEVOTHYROXINE (SYNTHROID, LEVOTHROID) 88 MCG TABLET    88 mcg daily before breakfast.    LORAZEPAM (ATIVAN) 0.5 MG TABLET       PROGESTERONE (PROMETRIUM) 100 MG CAPSULE    Take 150 mg by mouth daily.    Review of Systems  Constitutional: Positive for fatigue. Negative for chills.  HENT: Negative for congestion and sore throat.   Respiratory: Negative for cough and shortness of breath.   Cardiovascular: Negative for palpitations.  Neurological: Negative for dizziness and weakness.  Psychiatric/Behavioral: The patient has insomnia. The patient is not nervous/anxious.     History  Substance Use Topics  . Smoking status: Former Smoker -- 0.50 packs/day for 20 years    Types: Cigarettes    Quit date: 10/13/2004  . Smokeless  tobacco: Never Used  . Alcohol Use: No   Objective:   BP 102/70 mmHg  Pulse 82  Temp(Src) 98.5 F (36.9 C) (Oral)  Resp 16  Wt 165 lb 3.2 oz (74.934 kg)  Physical Exam  Constitutional: She is oriented to person, place, and time. She appears well-developed and well-nourished.  Neurological: She is alert and oriented to person, place, and time.  Psychiatric: She has a normal mood and affect. Her behavior is normal. Judgment and thought content normal.      Assessment & Plan:        1. Anxiety Think it has improved. Is still not sleeping well, but does not want another prescription medication.  Will call if worsens or does not improve. May consider medication at that time. Still concerned about weight gain.  Will attempt to taper medication as tolerated.    2. Episodic lightheadedness Improved.   3. Acquired hypothyroidism Stable. Continue medication.    Lorie Phenix, MD  [facility]

## 2015-02-25 ENCOUNTER — Encounter: Payer: Self-pay | Admitting: Family Medicine

## 2015-03-09 ENCOUNTER — Encounter: Payer: Self-pay | Admitting: Family Medicine

## 2015-04-23 ENCOUNTER — Encounter: Payer: Self-pay | Admitting: Family Medicine

## 2015-06-01 ENCOUNTER — Ambulatory Visit (INDEPENDENT_AMBULATORY_CARE_PROVIDER_SITE_OTHER): Payer: BC Managed Care – PPO | Admitting: Family Medicine

## 2015-06-01 ENCOUNTER — Encounter: Payer: Self-pay | Admitting: Family Medicine

## 2015-06-01 ENCOUNTER — Other Ambulatory Visit: Payer: Self-pay

## 2015-06-01 ENCOUNTER — Telehealth: Payer: Self-pay

## 2015-06-01 VITALS — BP 104/70 | HR 84 | Temp 98.4°F | Resp 16 | Ht 64.0 in | Wt 170.0 lb

## 2015-06-01 DIAGNOSIS — E162 Hypoglycemia, unspecified: Secondary | ICD-10-CM | POA: Insufficient documentation

## 2015-06-01 DIAGNOSIS — R002 Palpitations: Secondary | ICD-10-CM | POA: Diagnosis not present

## 2015-06-01 DIAGNOSIS — R55 Syncope and collapse: Secondary | ICD-10-CM | POA: Diagnosis not present

## 2015-06-01 DIAGNOSIS — F458 Other somatoform disorders: Secondary | ICD-10-CM | POA: Diagnosis not present

## 2015-06-01 DIAGNOSIS — R6882 Decreased libido: Secondary | ICD-10-CM | POA: Insufficient documentation

## 2015-06-01 MED ORDER — ESCITALOPRAM OXALATE 10 MG PO TABS: 10.0000 mg | ORAL_TABLET | Freq: Every day | ORAL | Status: DC

## 2015-06-01 NOTE — Telephone Encounter (Signed)
Pt reports she is having presyncopal sx, including nausea, vomiting, dizziness, and palpitations. Pt reports this has been going on for several months. (Pt was experiencing similar sx at [facility] in July.) Pt tried getting appointment with cardiology, but they advised her to see PCP to evaluate and refer if necessary. Pt denies chest pain, arm pain. Appointment made for 2:45 today. Advised pt to report to ED if new sx, such as chest pain occurs. Allene DillonEmily Drozdowski, CMA

## 2015-06-01 NOTE — Progress Notes (Signed)
Subjective:    Patient ID: Brittany Glover, female    DOB: 1961-04-06, 54 y.o.   MRN: [id number]  Dizziness This is a recurrent problem. The current episode started more than 1 year ago. The problem occurs 2 to 4 times per day. The problem has been gradually worsening. Associated symptoms include abdominal pain, chills, congestion, fatigue, headaches, nausea, vertigo, a visual change and vomiting. Pertinent negatives include no anorexia, change in bowel habit, chest pain, coughing, diaphoresis, fever, numbness, urinary symptoms or weakness. Associated symptoms comments: Has presyncopal episodes,  now has nausea and vomiting with it.  Happened when she was eating last night. Gets a funny feeling first.  Has a sensation. Gets light headed.   . Nothing (Does not happen with standing, usually with sitting,   not always eating.  Doesn't feel like a panic attack. Pulse does go up some.  ) aggravates the symptoms. She has tried nothing for the symptoms.    Review of Systems  Constitutional: Positive for chills and fatigue. Negative for fever and diaphoresis.  HENT: Positive for congestion.   Respiratory: Negative for cough.   Cardiovascular: Positive for palpitations. Negative for chest pain and leg swelling.  Gastrointestinal: Positive for nausea, vomiting and abdominal pain. Negative for anorexia and change in bowel habit.  Neurological: Positive for dizziness, vertigo and headaches. Negative for weakness and numbness.  Psychiatric/Behavioral: The patient is not nervous/anxious.      Patient Active Problem List   Diagnosis Date Noted  . Hypoglycemia 06/01/2015  . Vasovagal symptom 06/01/2015  . Anxiety 12/10/2014  . Acquired hypothyroidism 05/31/2014  . Episodic lightheadedness 08/14/2011   Past Medical History  Diagnosis Date  . Vitamin D deficiency   . Former smoker   . Premenopausal patient   . Thyroid disease     hypo   Current Outpatient Prescriptions on File Prior to Visit    Medication Sig  . levothyroxine (SYNTHROID, LEVOTHROID) 88 MCG tablet 88 mcg daily before breakfast.   . LORazepam (ATIVAN) 0.5 MG tablet Reported on 06/01/2015   No current facility-administered medications on file prior to visit.   No Known Allergies Past Surgical History  Procedure Laterality Date  . Tubal ligation    . Cholecystectomy    . Tonsillectomy and adenoidectomy  pt was 6   Social History   Social History  . Marital Status: Married    Spouse Name: N/A  . Number of Children: 2  . Years of Education: N/A   Occupational History  .      Full Time   Social History Main Topics  . Smoking status: Former Smoker -- 0.50 packs/day for 20 years    Types: Cigarettes    Quit date: 10/13/2004  . Smokeless tobacco: Never Used  . Alcohol Use: No     Comment: rare  . Drug Use: No  . Sexual Activity: Not on file   Other Topics Concern  . Not on file   Social History Narrative   Family History  Problem Relation Age of Onset  . Heart attack Father   . Irregular heart beat Father     Had a pacemaker  . Healthy Mother       Objective:   Physical Exam  Constitutional: She is oriented to person, place, and time. She appears well-developed and well-nourished.  Neurological: She is alert and oriented to person, place, and time.  Psychiatric: She has a normal mood and affect. Her behavior is normal. Judgment and thought content normal.  BP 104/70 mmHg  Pulse 84  Temp(Src) 98.4 F (36.9 C) (Oral)  Resp 16  Ht  (1.626 m)  Wt 170 lb (77.111 kg)  BMI 29.17 kg/m2  SpO2 97%     Assessment & Plan:  1. Palpitations Normal EKG again.   - EKG 12-Lead  2. Decreased libido Continue current medication and plan.    3. Vasovagal symptom Unclear is cause of symptoms.  4. Vasomotor instability Worsening. Will restart medication and recheck in 4 weeks. Patient instructed to call back if condition worsens or does not improve.    - escitalopram (LEXAPRO) 10 MG  tablet; Take 1 tablet (10 mg total) by mouth daily. 1/2 for one week and then one a day.  Dispense: 30 tablet; Refill: 5   Lorie Phenix, MD       .

## 2015-06-01 NOTE — Patient Instructions (Signed)
Syncope, commonly known as fainting, is a temporary loss of consciousness. It occurs when the blood flow to the brain is reduced. Vasovagal syncope (also called neurocardiogenic syncope) is a fainting spell in which the blood flow to the brain is reduced because of a sudden drop in heart rate and blood pressure. Vasovagal syncope occurs when the brain and the cardiovascular system (blood vessels) do not adequately communicate and respond to each other. This is the most common cause of fainting. It often occurs in response to fear or some other type of emotional or physical stress. The body has a reaction in which the heart starts beating too slowly or the blood vessels expand, reducing blood pressure. This type of fainting spell is generally considered harmless. However, injuries can occur if a person takes a sudden fall during a fainting spell.   CAUSES   Vasovagal syncope occurs when a person's blood pressure and heart rate decrease suddenly, usually in response to a trigger. Many things and situations can trigger an episode. Some of these include:    Pain.    Fear.    The sight of blood or medical procedures, such as blood being drawn from a vein.    Common activities, such as coughing, swallowing, stretching, or going to the bathroom.    Emotional stress.    Prolonged standing, especially in a warm environment.    Lack of sleep or rest.    Prolonged lack of food.    Prolonged lack of fluids.    Recent illness.   The use of certain drugs that affect blood pressure, such as cocaine, alcohol, marijuana, inhalants, and opiates.   SYMPTOMS   Before the fainting episode, you may:    Feel dizzy or light headed.    Become pale.   Sense that you are going to faint.    Feel like the room is spinning.    Have tunnel vision, only seeing directly in front of you.    Feel sick to your stomach (nauseous).    See spots or slowly lose vision.    Hear ringing in your ears.    Have a headache.     Feel warm and sweaty.    Feel a sensation of pins and needles.  During the fainting spell, you will generally be unconscious for no longer than a couple minutes before waking up and returning to normal. If you get up too quickly before your body can recover, you may faint again. Some twitching or jerky movements may occur during the fainting spell.   DIAGNOSIS   Your health care provider will ask about your symptoms, take a medical history, and perform a physical exam. Various tests may be done to rule out other causes of fainting. These may include blood tests and tests to check the heart, such as electrocardiography, echocardiography, and possibly an electrophysiology study. When other causes have been ruled out, a test may be done to check the body's response to changes in position (tilt table test).  TREATMENT   Most cases of vasovagal syncope do not require treatment. Your health care provider may recommend ways to avoid fainting triggers and may provide home strategies for preventing fainting. If you must be exposed to a possible trigger, you can drink additional fluids to help reduce your chances of having an episode of vasovagal syncope. If you have warning signs of an oncoming episode, you can respond by positioning yourself favorably (lying down).  If your fainting spells continue, you may be   given medicines to prevent fainting. Some medicines may help make you more resistant to repeated episodes of vasovagal syncope. Special exercises or compression stockings may be recommended. In rare cases, the surgical placement of a pacemaker is considered.  HOME CARE INSTRUCTIONS    Learn to identify the warning signs of vasovagal syncope.    Sit or lie down at the first warning sign of a fainting spell. If sitting, put your head down between your legs. If you lie down, swing your legs up in the air to increase blood flow to the brain.    Avoid hot tubs and saunas.   Avoid prolonged standing.   Drink  enough fluids to keep your urine clear or pale yellow. Avoid caffeine.   Increase salt in your diet as directed by your health care provider.    If you have to stand for a long time, perform movements such as:     Crossing your legs.     Flexing and stretching your leg muscles.     Squatting.     Moving your legs.     Bending over.    Only take over-the-counter or prescription medicines as directed by your health care provider. Do not suddenly stop any medicines without asking your health care provider first.  SEEK MEDICAL CARE IF:    Your fainting spells continue or happen more frequently in spite of treatment.    You lose consciousness for more than a couple minutes.   You have fainting spells during or after exercising or after being startled.    You have new symptoms that occur with the fainting spells, such as:     Shortness of breath.    Chest pain.     Irregular heartbeat.    You have episodes of twitching or jerky movements that last longer than a few seconds.   You have episodes of twitching or jerky movements without obvious fainting.  SEEK IMMEDIATE MEDICAL CARE IF:    You have injuries or bleeding after a fainting spell.    You have episodes of twitching or jerky movements that last longer than 5 minutes.    You have more than one spell of twitching or jerky movements before returning to consciousness after fainting.     This information is not intended to replace advice given to you by your health care provider. Make sure you discuss any questions you have with your health care provider.     Document Released: 05/08/2012 Document Revised: 10/06/2014 Document Reviewed: 05/08/2012  Elsevier Interactive Patient Education 2016 [employer]

## 2015-06-08 ENCOUNTER — Encounter: Payer: Self-pay | Admitting: Family Medicine

## 2015-06-08 ENCOUNTER — Ambulatory Visit (INDEPENDENT_AMBULATORY_CARE_PROVIDER_SITE_OTHER): Payer: BC Managed Care – PPO | Admitting: Family Medicine

## 2015-06-08 VITALS — BP 110/72 | HR 74 | Temp 98.1°F | Resp 16 | Wt 166.4 lb

## 2015-06-08 DIAGNOSIS — J3489 Other specified disorders of nose and nasal sinuses: Secondary | ICD-10-CM

## 2015-06-08 MED ORDER — AMOXICILLIN-POT CLAVULANATE 875-125 MG PO TABS: 1.0000 | ORAL_TABLET | Freq: Two times a day (BID) | ORAL | Status: DC

## 2015-06-08 MED ORDER — FLUTICASONE PROPIONATE 50 MCG/ACT NA SUSP: 2.0000 | Freq: Every day | NASAL | Status: DC

## 2015-06-08 NOTE — Patient Instructions (Addendum)
Discussed use of Mucinex D. Start antibiotic if sinuses not improving with spray or you notice discolored drainage.

## 2015-06-08 NOTE — Progress Notes (Signed)
Subjective:     Patient ID: Brittany Glover, female   DOB: January 20, 1961, 55 y.o.   MRN: [id number]  HPI  Chief Complaint  Patient presents with  . Ear Pain    Patient comes in office today with concerns of bilateral ear pain and sinus pain/pressure since 06/03/15. Patient states that when sitting up for long periods she begins to feel nauseas, she has tried taking otc Tylenol Sinus for relief.   Reports initial scratchy throat and mild PND but no significant cough.States the sinus pressure has been present for weeks with no associated drainage or hx of allergies reported.   Review of Systems  Constitutional: Negative for fever and chills.       Objective:   Physical Exam  Constitutional: She appears well-developed and well-nourished. No distress.  Ears: T.M's intact without inflammation Sinuses: mild maxillary sinus tenderness Throat: no tonsillar enlargement or exudate Neck: no cervical adenopathy Lungs: clear     Assessment:    1. Sinus pressure: ? Allergy ? Acute URI on chronic sinus infection - fluticasone (FLONASE) 50 MCG/ACT nasal spray; Place 2 sprays into both nostrils daily.  Dispense: 16 g; Refill: 0 - amoxicillin-clavulanate (AUGMENTIN) 875-125 MG tablet; Take 1 tablet by mouth 2 (two) times daily.  Dispense: 20 tablet; Refill: 0    Plan:    Try Mucinex D and steroid spray first. If not improving start antibiotic.

## 2015-07-05 ENCOUNTER — Ambulatory Visit (INDEPENDENT_AMBULATORY_CARE_PROVIDER_SITE_OTHER): Payer: BC Managed Care – PPO | Admitting: Family Medicine

## 2015-07-05 ENCOUNTER — Encounter: Payer: Self-pay | Admitting: Family Medicine

## 2015-07-05 VITALS — BP 100/62 | HR 80 | Temp 98.1°F | Resp 16 | Wt 169.0 lb

## 2015-07-05 DIAGNOSIS — R55 Syncope and collapse: Secondary | ICD-10-CM | POA: Diagnosis not present

## 2015-07-05 DIAGNOSIS — J069 Acute upper respiratory infection, unspecified: Secondary | ICD-10-CM | POA: Diagnosis not present

## 2015-07-05 MED ORDER — ESCITALOPRAM OXALATE 10 MG PO TABS: 15.0000 mg | ORAL_TABLET | Freq: Every day | ORAL | Status: DC

## 2015-07-05 NOTE — Progress Notes (Signed)
Subjective:    Patient ID: Brittany Glover, female    DOB: 10-11-1960, 55 y.o.   MRN: [id number]  Dizziness Episode onset: FU from 06/01/2015.Pt was diagnosed with vasomotor instability (possibly due to vasovagal sx?). Pt restarted Lexapro at LOV. Episode frequency: 1 time every 1-2 days, lasting a couple of minutes. The problem has been gradually improving (80% improved since LOV). Associated symptoms include arthralgias, congestion, coughing, fatigue, nausea and a visual change. Pertinent negatives include no abdominal pain, anorexia, change in bowel habit, chest pain, chills, diaphoresis, fever, headaches, numbness, sore throat, vertigo, vomiting or weakness. Treatments tried: SSRI- Lexapro 10 mg. The treatment provided significant relief.    Review of Systems  Constitutional: Positive for fatigue. Negative for fever, chills and diaphoresis.  HENT: Positive for congestion. Negative for sore throat.   Respiratory: Positive for cough.   Cardiovascular: Negative for chest pain.  Gastrointestinal: Positive for nausea. Negative for vomiting, abdominal pain, anorexia and change in bowel habit.  Musculoskeletal: Positive for arthralgias.  Neurological: Positive for dizziness. Negative for vertigo, weakness, numbness and headaches.   BP 100/62 mmHg  Pulse 80  Temp(Src) 98.1 F (36.7 C) (Oral)  Resp 16  Wt 169 lb (76.658 kg)   Patient Active Problem List   Diagnosis Date Noted  . Hypoglycemia 06/01/2015  . Vasovagal symptom 06/01/2015  . Decreased libido 06/01/2015  . Vasomotor instability 06/01/2015  . Anxiety 12/10/2014  . Acquired hypothyroidism 05/31/2014  . Episodic lightheadedness 08/14/2011   Past Medical History  Diagnosis Date  . Vitamin D deficiency   . Former smoker   . Premenopausal patient   . Thyroid disease     hypo   Current Outpatient Prescriptions on File Prior to Visit  Medication Sig  . escitalopram (LEXAPRO) 10 MG tablet Take 1 tablet (10 mg total) by  mouth daily. 1/2 for one week and then one a day.  . fluticasone (FLONASE) 50 MCG/ACT nasal spray Place 2 sprays into both nostrils daily.  Marland Kitchen levothyroxine (SYNTHROID, LEVOTHROID) 88 MCG tablet 88 mcg daily before breakfast.   . Testosterone 75 MG PLLT Inject into the skin.   No current facility-administered medications on file prior to visit.   No Known Allergies Past Surgical History  Procedure Laterality Date  . Tubal ligation    . Cholecystectomy    . Tonsillectomy and adenoidectomy  pt was 6   Social History   Social History  . Marital Status: Married    Spouse Name: N/A  . Number of Children: 2  . Years of Education: N/A   Occupational History  .      Full Time   Social History Main Topics  . Smoking status: Former Smoker -- 0.50 packs/day for 20 years    Types: Cigarettes    Quit date: 10/13/2004  . Smokeless tobacco: Never Used  . Alcohol Use: No     Comment: rare  . Drug Use: No  . Sexual Activity: Not on file   Other Topics Concern  . Not on file   Social History Narrative   Family History  Problem Relation Age of Onset  . Heart attack Father   . Irregular heart beat Father     Had a pacemaker  . Healthy Mother       Objective:   Physical Exam  Constitutional: She is oriented to person, place, and time. She appears well-developed and well-nourished.  HENT:  Head: Normocephalic and atraumatic.  Mouth/Throat: Oropharynx is clear and moist.  Eyes: Conjunctivae  are normal. Pupils are equal, round, and reactive to light.  Neck: Normal range of motion. Neck supple.  Cardiovascular: Normal rate and regular rhythm.   Pulmonary/Chest: Effort normal and breath sounds normal.  Neurological: She is alert and oriented to person, place, and time.  Psychiatric: She has a normal mood and affect. Her behavior is normal. Judgment and thought content normal.   BP 100/62 mmHg  Pulse 80  Temp(Src) 98.1 F (36.7 C) (Oral)  Resp 16  Wt 169 lb (76.658 kg)        Assessment & Plan:  1. Vasomotor instability Improved, but not quite at goal.  Will try increase to 15 mg and recheck in 4 weeks.   - escitalopram (LEXAPRO) 10 MG tablet; Take 1.5 tablets (15 mg total) by mouth daily. 1/2 for one week and then one a day.  Dispense: 45 tablet; Refill: 5  2. Upper respiratory infection New problem.  Will try OTC medication and call if worsens or does not improve.  Patient was seen and examined by Leo Grosser, MD, and note scribed by Allene Dillon, CMA. I have reviewed the document for accuracy and completeness and I agree with above. Leo Grosser, MD   Lorie Phenix, MD

## 2015-07-13 ENCOUNTER — Telehealth: Payer: Self-pay | Admitting: Family Medicine

## 2015-07-13 MED ORDER — AMOXICILLIN-POT CLAVULANATE 875-125 MG PO TABS: 1.0000 | ORAL_TABLET | Freq: Two times a day (BID) | ORAL | Status: DC

## 2015-07-13 NOTE — Telephone Encounter (Signed)
Pt was in last week for FU and had a cold,.  She thinks it has developed into a sinus infection.  She wants to know if you can call in an antibiotic at [facility].  Her call back is [phone]  Thanks, Barth Kirks

## 2015-07-26 ENCOUNTER — Other Ambulatory Visit: Payer: Self-pay | Admitting: Family Medicine

## 2015-07-26 DIAGNOSIS — R55 Syncope and collapse: Secondary | ICD-10-CM

## 2015-07-26 MED ORDER — ESCITALOPRAM OXALATE 10 MG PO TABS: 15.0000 mg | ORAL_TABLET | Freq: Every day | ORAL | Status: DC

## 2015-07-26 NOTE — Telephone Encounter (Signed)
Pt contacted office for refill request on the following medications:  escitalopram (LEXAPRO) 10 MG tablet.  Pt is requesting this resent for .  (Dr Elease Hashimoto changed the dosage back in Jan.)  Cloretta Ned.  CB#[phone]/MW

## 2016-01-28 ENCOUNTER — Ambulatory Visit (INDEPENDENT_AMBULATORY_CARE_PROVIDER_SITE_OTHER): Payer: BC Managed Care – PPO | Admitting: Physician Assistant

## 2016-01-28 DIAGNOSIS — R55 Syncope and collapse: Secondary | ICD-10-CM

## 2016-01-28 MED ORDER — ESCITALOPRAM OXALATE 10 MG PO TABS: 15.0000 mg | ORAL_TABLET | Freq: Every day | ORAL | 5 refills | Status: DC

## 2016-01-28 NOTE — Progress Notes (Signed)
       Patient: Brittany ChickBelinda J Glover Female    DOB: 09/06/1960   [age] y.o.   MRN: [id number] Visit Date: 01/28/2016  Today's Provider: Margaretann LovelessJennifer M Connor Meacham, PA-C   Chief Complaint  Patient presents with  . Anxiety  . Hypothyroidism   Subjective:    HPI  Patient is here for follow up and get established with Victorino DikeJennifer since Dr Elease HashimotoMaloney left. Patient is doing well on current medication regimen of Lexapro 15mg  for vasomotor instability. She has no complaints today.    No Known Allergies Current Meds  Medication Sig  . escitalopram (LEXAPRO) 10 MG tablet Take 1.5 tablets (15 mg total) by mouth daily.  Marland Kitchen. levothyroxine (SYNTHROID, LEVOTHROID) 88 MCG tablet 88 mcg daily before breakfast.   . MINIVELLE 0.025 MG/24HR   . Multiple Vitamins-Minerals (CENTRUM SILVER PO) Take by mouth daily.  . Omega-3 Fatty Acids (FISH OIL) 1000 MG CAPS Take by mouth daily.  . progesterone (PROMETRIUM) 100 MG capsule Take 100 mg by mouth daily.    Review of Systems  Constitutional: Negative.   Respiratory: Negative.   Cardiovascular: Negative.   Musculoskeletal: Negative.   Neurological: Negative.   Psychiatric/Behavioral: Negative.     Social History  Substance Use Topics  . Smoking status: Former Smoker    Packs/day: 0.50    Years: 20.00    Types: Cigarettes    Quit date: 10/13/2004  . Smokeless tobacco: Never Used  . Alcohol use No     Comment: rare   Objective:   BP 122/78   Pulse 84   Resp 16   Wt 172 lb (78 kg)   BMI 29.52 kg/m   Physical Exam  Constitutional: She appears well-developed and well-nourished. No distress.  Neck: Normal range of motion. Neck supple. No tracheal deviation present. No thyromegaly present.  Cardiovascular: Normal rate, regular rhythm and normal heart sounds.  Exam reveals no gallop and no friction rub.   No murmur heard. Pulmonary/Chest: Effort normal and breath sounds normal. No respiratory distress. She has no wheezes. She has no rales.  Musculoskeletal:  She exhibits no edema.  Lymphadenopathy:    She has no cervical adenopathy.  Skin: She is not diaphoretic.  Psychiatric: She has a normal mood and affect. Her behavior is normal. Judgment and thought content normal.  Vitals reviewed.     Assessment & Plan:     1. Vasomotor instability Stable. Diagnosis pulled for medication refill. Continue current medical treatment plan. Patient will call back to schedule CPE in Oct 2017. Transferring physical from [facility], previously seen by Levin ErpAlicia Copeland, PA-C. - escitalopram (LEXAPRO) 10 MG tablet; Take 1.5 tablets (15 mg total) by mouth daily.  Dispense: 45 tablet; Refill: 5       Margaretann LovelessJennifer M Keaton Beichner, PA-C  [facility]

## 2016-01-28 NOTE — Patient Instructions (Signed)
Escitalopram tablets  What is this medicine?  ESCITALOPRAM (es sye TAL oh pram) is used to treat depression and certain types of anxiety.  This medicine may be used for other purposes; ask your health care provider or pharmacist if you have questions.  What should I tell my health care provider before I take this medicine?  They need to know if you have any of these conditions:  -bipolar disorder or a family history of bipolar disorder  -diabetes  -glaucoma  -heart disease  -kidney or liver disease  -receiving electroconvulsive therapy  -seizures (convulsions)  -suicidal thoughts, plans, or attempt by you or a family member  -an unusual or allergic reaction to escitalopram, the related drug citalopram, other medicines, foods, dyes, or preservatives  -pregnant or trying to become pregnant  -breast-feeding  How should I use this medicine?  Take this medicine by mouth with a glass of water. Follow the directions on the prescription label. You can take it with or without food. If it upsets your stomach, take it with food. Take your medicine at regular intervals. Do not take it more often than directed. Do not stop taking this medicine suddenly except upon the advice of your doctor. Stopping this medicine too quickly may cause serious side effects or your condition may worsen.  A special MedGuide will be given to you by the pharmacist with each prescription and refill. Be sure to read this information carefully each time.  Talk to your pediatrician regarding the use of this medicine in children. Special care may be needed.  Overdosage: If you think you have taken too much of this medicine contact a poison control center or emergency room at once.  NOTE: This medicine is only for you. Do not share this medicine with others.  What if I miss a dose?  If you miss a dose, take it as soon as you can. If it is almost time for your next dose, take only that dose. Do not take double or extra doses.  What may interact with this  medicine?  Do not take this medicine with any of the following medications:  -certain medicines for fungal infections like fluconazole, itraconazole, ketoconazole, posaconazole, voriconazole  -cisapride  -citalopram  -dofetilide  -dronedarone  -linezolid  -MAOIs like Carbex, Eldepryl, Marplan, Nardil, and Parnate  -methylene blue (injected into a vein)  -pimozide  -thioridazine  -ziprasidone  This medicine may also interact with the following medications:  -alcohol  -aspirin and aspirin-like medicines  -carbamazepine  -certain medicines for depression, anxiety, or psychotic disturbances  -certain medicines for migraine headache like almotriptan, eletriptan, frovatriptan, naratriptan, rizatriptan, sumatriptan, zolmitriptan  -certain medicines for sleep  -certain medicines that treat or prevent blood clots like warfarin, enoxaparin, dalteparin  -cimetidine  -diuretics  -fentanyl  -furazolidone  -isoniazid  -lithium  -metoprolol  -NSAIDs, medicines for pain and inflammation, like ibuprofen or naproxen  -other medicines that prolong the QT interval (cause an abnormal heart rhythm)  -procarbazine  -rasagiline  -supplements like St. John's wort, kava kava, valerian  -tramadol  -tryptophan  This list may not describe all possible interactions. Give your health care provider a list of all the medicines, herbs, non-prescription drugs, or dietary supplements you use. Also tell them if you smoke, drink alcohol, or use illegal drugs. Some items may interact with your medicine.  What should I watch for while using this medicine?  Tell your doctor if your symptoms do not get better or if they get worse. Visit your doctor   or health care professional for regular checks on your progress. Because it may take several weeks to see the full effects of this medicine, it is important to continue your treatment as prescribed by your doctor.  Patients and their families should watch out for new or worsening thoughts of suicide or  depression. Also watch out for sudden changes in feelings such as feeling anxious, agitated, panicky, irritable, hostile, aggressive, impulsive, severely restless, overly excited and hyperactive, or not being able to sleep. If this happens, especially at the beginning of treatment or after a change in dose, call your health care professional.  You may get drowsy or dizzy. Do not drive, use machinery, or do anything that needs mental alertness until you know how this medicine affects you. Do not stand or sit up quickly, especially if you are an older patient. This reduces the risk of dizzy or fainting spells. Alcohol may interfere with the effect of this medicine. Avoid alcoholic drinks.  Your mouth may get dry. Chewing sugarless gum or sucking hard candy, and drinking plenty of water may help. Contact your doctor if the problem does not go away or is severe.  What side effects may I notice from receiving this medicine?  Side effects that you should report to your doctor or health care professional as soon as possible:  -allergic reactions like skin rash, itching or hives, swelling of the face, lips, or tongue  -confusion  -feeling faint or lightheaded, falls  -fast talking and excited feelings or actions that are out of control  -hallucination, loss of contact with reality  -seizures  -suicidal thoughts or other mood changes  -unusual bleeding or bruising  Side effects that usually do not require medical attention (report to your doctor or health care professional if they continue or are bothersome):  -blurred vision  -changes in appetite  -change in sex drive or performance  -headache  -increased sweating  -nausea  This list may not describe all possible side effects. Call your doctor for medical advice about side effects. You may report side effects to FDA at 1-800-FDA-1088.  Where should I keep my medicine?  Keep out of reach of children.  Store at room temperature between 15 and 30 degrees C (59 and 86 degrees  F). Throw away any unused medicine after the expiration date.  NOTE: This sheet is a summary. It may not cover all possible information. If you have questions about this medicine, talk to your doctor, pharmacist, or health care provider.     © 2016, [employer]vier/[employer]. (2012-12-17 12:32:55)

## 2016-03-13 ENCOUNTER — Other Ambulatory Visit: Payer: Self-pay | Admitting: Physician Assistant

## 2016-03-13 DIAGNOSIS — Z1231 Encounter for screening mammogram for malignant neoplasm of breast: Secondary | ICD-10-CM

## 2016-04-13 ENCOUNTER — Ambulatory Visit
Admission: RE | Admit: 2016-04-13 | Discharge: 2016-04-13 | Disposition: A | Discharge status: Home / Self Care | Payer: BC Managed Care – PPO | Source: Ambulatory Visit | Attending: Physician Assistant | Admitting: Physician Assistant

## 2016-04-13 DIAGNOSIS — Z1231 Encounter for screening mammogram for malignant neoplasm of breast: Secondary | ICD-10-CM | POA: Diagnosis not present

## 2016-04-14 ENCOUNTER — Other Ambulatory Visit: Payer: Self-pay | Admitting: Physician Assistant

## 2016-04-14 ENCOUNTER — Encounter: Payer: Self-pay | Admitting: Physician Assistant

## 2016-04-14 ENCOUNTER — Ambulatory Visit (INDEPENDENT_AMBULATORY_CARE_PROVIDER_SITE_OTHER): Payer: BC Managed Care – PPO | Admitting: Physician Assistant

## 2016-04-14 VITALS — BP 100/60 | HR 80 | Temp 98.1°F | Resp 16 | Ht 64.0 in | Wt 170.8 lb

## 2016-04-14 DIAGNOSIS — Z0000 Encounter for general adult medical examination without abnormal findings: Secondary | ICD-10-CM

## 2016-04-14 DIAGNOSIS — Z833 Family history of diabetes mellitus: Secondary | ICD-10-CM

## 2016-04-14 DIAGNOSIS — Z136 Encounter for screening for cardiovascular disorders: Secondary | ICD-10-CM

## 2016-04-14 DIAGNOSIS — Z1231 Encounter for screening mammogram for malignant neoplasm of breast: Secondary | ICD-10-CM | POA: Diagnosis not present

## 2016-04-14 DIAGNOSIS — Z1159 Encounter for screening for other viral diseases: Secondary | ICD-10-CM

## 2016-04-14 DIAGNOSIS — N959 Unspecified menopausal and perimenopausal disorder: Secondary | ICD-10-CM

## 2016-04-14 DIAGNOSIS — E162 Hypoglycemia, unspecified: Secondary | ICD-10-CM | POA: Diagnosis not present

## 2016-04-14 DIAGNOSIS — Z13220 Encounter for screening for lipoid disorders: Secondary | ICD-10-CM

## 2016-04-14 DIAGNOSIS — E039 Hypothyroidism, unspecified: Secondary | ICD-10-CM | POA: Diagnosis not present

## 2016-04-14 DIAGNOSIS — Z124 Encounter for screening for malignant neoplasm of cervix: Secondary | ICD-10-CM | POA: Diagnosis not present

## 2016-04-14 DIAGNOSIS — Z1239 Encounter for other screening for malignant neoplasm of breast: Secondary | ICD-10-CM

## 2016-04-14 NOTE — Progress Notes (Signed)
Patient: Brittany Glover, Female    DOB: [birth year], 54 y.o.   MRN: [id number] Visit Date: 04/14/2016  Today's Provider: Margaretann Loveless, PA-C   Chief Complaint  Patient presents with  . Annual Exam   Subjective:    Annual physical exam Brittany Glover is a 55 y.o. female who presents today for health maintenance and complete physical. She feels well. She reports exercising none. She reports she is sleeping poorly.She reports that lately she is waking up more during the night.  She reports she received the flu vaccine at work (works for [employer]) on 03/30/16  03/25/15 Mammogram-BI-RADS 1.She had her mammogram done yesterday morning. 02/17/2015 Colonoscopy-Polyps Dr.Eliiot, Recheck in 02/2018 -----------------------------------------------------------------   Review of Systems  Constitutional: Negative.   HENT: Positive for ear pain and sinus pressure.   Eyes: Negative.   Respiratory: Negative.   Cardiovascular: Negative.   Gastrointestinal: Negative.   Endocrine: Positive for cold intolerance.  Genitourinary: Negative.   Musculoskeletal: Negative.   Skin: Negative.   Allergic/Immunologic: Negative.   Neurological: Positive for light-headedness.  Hematological: Negative.   Psychiatric/Behavioral: Positive for sleep disturbance.    Social History      She  reports that she quit smoking about 11 years ago. Her smoking use included Cigarettes. She has a 10.00 pack-year smoking history. She has never used smokeless tobacco. She reports that she does not drink alcohol or use drugs.       Social History   Social History  . Marital status: Married    Spouse name: N/A  . Number of children: 2  . Years of education: N/A   Occupational History  .      Full Time   Social History Main Topics  . Smoking status: Former Smoker    Packs/day: 0.50    Years: 20.00    Types: Cigarettes    Quit date: 10/13/2004  . Smokeless tobacco: Never Used  .  Alcohol use No     Comment: rare  . Drug use: No  . Sexual activity: Not Asked   Other Topics Concern  . None   Social History Narrative  . None    Past Medical History:  Diagnosis Date  . Former smoker   . Premenopausal patient   . Thyroid disease    hypo  . Vitamin D deficiency      Patient Active Problem List   Diagnosis Date Noted  . Upper respiratory infection 07/05/2015  . Hypoglycemia 06/01/2015  . Vasovagal symptom 06/01/2015  . Decreased libido 06/01/2015  . Vasomotor instability 06/01/2015  . Anxiety 12/10/2014  . Acquired hypothyroidism 05/31/2014  . Episodic lightheadedness 08/14/2011    Past Surgical History:  Procedure Laterality Date  . CHOLECYSTECTOMY    . TONSILLECTOMY AND ADENOIDECTOMY  pt was 6  . TUBAL LIGATION      Family History        Family Status  Relation Status  . Father Deceased at age 80  . Mother Alive  . Sister Alive  . Brother Alive  . Neg Hx         Her family history includes Healthy in her mother; Heart attack in her father; Irregular heart beat in her father.     No Known Allergies   Current Outpatient Prescriptions:  .  escitalopram (LEXAPRO) 10 MG tablet, Take 1.5 tablets (15 mg total) by mouth daily., Disp: 45 tablet, Rfl: 5 .  levothyroxine (SYNTHROID, LEVOTHROID) 88 MCG tablet, 88  mcg daily before breakfast. , Disp: , Rfl:  .  MINIVELLE 0.025 MG/24HR, , Disp: , Rfl:  .  Omega-3 Fatty Acids (FISH OIL) 1000 MG CAPS, Take by mouth daily., Disp: , Rfl:  .  progesterone (PROMETRIUM) 100 MG capsule, Take 100 mg by mouth daily., Disp: , Rfl:  .  Multiple Vitamins-Minerals (CENTRUM SILVER PO), Take by mouth daily., Disp: , Rfl:    Patient Care Team: Margaretann LovelessJennifer M Hala Narula, PA-C as PCP - General (Family Medicine)      Objective:   Vitals: BP 100/60 (BP Location: Right Arm, Patient Position: Sitting, Cuff Size: Normal)   Pulse 80   Temp 98.1 F (36.7 C) (Oral)   Resp 16   Ht 5\' 4"  (1.626 m)   Wt 170 lb 12.8 oz  (77.5 kg)   BMI 29.32 kg/m    Physical Exam  Constitutional: She is oriented to person, place, and time. She appears well-developed and well-nourished. No distress.  HENT:  Head: Normocephalic and atraumatic.  Right Ear: Hearing, tympanic membrane, external ear and ear canal normal.  Left Ear: Hearing, tympanic membrane, external ear and ear canal normal.  Nose: Nose normal.  Mouth/Throat: Uvula is midline, oropharynx is clear and moist and mucous membranes are normal. No oropharyngeal exudate.  Eyes: Conjunctivae and EOM are normal. Pupils are equal, round, and reactive to light. Right eye exhibits no discharge. Left eye exhibits no discharge. No scleral icterus.  Neck: Normal range of motion. Neck supple. No JVD present. Carotid bruit is not present. No tracheal deviation present. No thyromegaly present.  Cardiovascular: Normal rate, regular rhythm, normal heart sounds and intact distal pulses.  Exam reveals no gallop and no friction rub.   No murmur heard. Pulmonary/Chest: Effort normal and breath sounds normal. No respiratory distress. She has no wheezes. She has no rales. She exhibits no tenderness. Right breast exhibits no inverted nipple, no mass, no nipple discharge, no skin change and no tenderness. Left breast exhibits no inverted nipple, no mass, no nipple discharge, no skin change and no tenderness. Breasts are symmetrical.  Abdominal: Soft. Bowel sounds are normal. She exhibits no distension and no mass. There is no tenderness. There is no rebound and no guarding. Hernia confirmed negative in the right inguinal area and confirmed negative in the left inguinal area.  Genitourinary: Rectum normal, vagina normal and uterus normal. No breast swelling, tenderness, discharge or bleeding. Pelvic exam was performed with patient supine. There is no rash, tenderness, lesion or injury on the right labia. There is no rash, tenderness, lesion or injury on the left labia. Cervix exhibits no motion  tenderness, no discharge and no friability. Right adnexum displays no mass, no tenderness and no fullness. Left adnexum displays no mass, no tenderness and no fullness. No erythema, tenderness or bleeding in the vagina. No signs of injury around the vagina. No vaginal discharge found.  Musculoskeletal: Normal range of motion. She exhibits no edema or tenderness.  Lymphadenopathy:    She has no cervical adenopathy.       Right: No inguinal adenopathy present.       Left: No inguinal adenopathy present.  Neurological: She is alert and oriented to person, place, and time. She has normal reflexes. No cranial nerve deficit. Coordination normal.  Skin: Skin is warm and dry. No rash noted. She is not diaphoretic.  Psychiatric: She has a normal mood and affect. Her behavior is normal. Judgment and thought content normal.  Vitals reviewed.    Depression Screen PHQ  2/9 Scores 11/04/2014  Exception Documentation Other- indicate reason in comment box  Not completed Pt has had concussion; has been lying in a dark room for weeks      Assessment & Plan:     Routine Health Maintenance and Physical Exam  Exercise Activities and Dietary recommendations Goals    None      Immunization History  Administered Date(s) Administered  . Influenza-Unspecified 03/06/2015    Health Maintenance  Topic Date Due  . Hepatitis C Screening  [birth year]  . HIV Screening  09/30/1976  . TETANUS/TDAP  09/30/1980  . PAP SMEAR  10/01/1982  . INFLUENZA VACCINE  01/04/2016  . MAMMOGRAM  03/24/2017  . COLONOSCOPY  02/16/2025     Discussed health benefits of physical activity, and encouraged her to engage in regular exercise appropriate for her age and condition.    1. Annual physical exam Normal physical exam today. Will check labs as below and f/u pending lab results. If labs are stable and WNL she will not need to have these rechecked for one year at her next annual physical exam. She is to call the office in  the meantime if she has any acute issue, questions or concerns. - CBC with Differential/Platelet - Comprehensive metabolic panel - TSH  2. Cervical cancer screening Pap collected today. Will send as below and f/u pending results. - Pap IG and HPV (high risk) DNA detection  3. Breast cancer screening Mammogram just completed. Awaiting results.   4. Acquired hypothyroidism Will check labs as below and f/u pending results.  5. Hypoglycemia Will check labs as below and f/u pending results.  6. Need for hepatitis C screening test - Hepatitis C antibody  7. Family history of diabetes mellitus Will check labs as below and f/u pending results. - Hemoglobin A1c  8. Encounter for lipid screening for cardiovascular disease Will check labs as below and f/u pending results. - Lipid panel  --------------------------------------------------------------------    Margaretann LovelessJennifer M Shamera Yarberry, PA-C  [facility]

## 2016-04-14 NOTE — Patient Instructions (Signed)

## 2016-04-18 MED ORDER — ESTRADIOL 0.025 MG/24HR TD PTTW: 1.0000 | MEDICATED_PATCH | TRANSDERMAL | 12 refills | Status: DC

## 2016-04-19 ENCOUNTER — Telehealth: Payer: Self-pay

## 2016-04-19 LAB — LIPID PANEL
CHOLESTEROL TOTAL: 270 mg/dL — AB (ref 100–199)
Chol/HDL Ratio: 3.7 ratio units (ref 0.0–4.4)
HDL: 73 mg/dL (ref >39)
LDL CALC: 178 mg/dL — AB (ref 0–99)
TRIGLYCERIDES: 95 mg/dL (ref 0–149)
VLDL CHOLESTEROL CAL: 19 mg/dL (ref 5–40)

## 2016-04-19 LAB — COMPREHENSIVE METABOLIC PANEL
ALBUMIN: 4.3 g/dL (ref 3.5–5.5)
ALT: 26 IU/L (ref 0–32)
AST: 22 IU/L (ref 0–40)
Albumin/Globulin Ratio: 1.7 (ref 1.2–2.2)
Alkaline Phosphatase: 80 IU/L (ref 39–117)
BILIRUBIN TOTAL: 0.3 mg/dL (ref 0.0–1.2)
BUN / CREAT RATIO: 13 (ref 9–23)
BUN: 12 mg/dL (ref 6–24)
CHLORIDE: 102 mmol/L (ref 96–106)
CO2: 26 mmol/L (ref 18–29)
Calcium: 9.5 mg/dL (ref 8.7–10.2)
Creatinine, Ser: 0.91 mg/dL (ref 0.57–1.00)
GFR calc non Af Amer: 72 mL/min/{1.73_m2} (ref >59)
GFR, EST AFRICAN AMERICAN: 83 mL/min/{1.73_m2} (ref >59)
GLUCOSE: 92 mg/dL (ref 65–99)
Globulin, Total: 2.5 g/dL (ref 1.5–4.5)
Potassium: 4.9 mmol/L (ref 3.5–5.2)
Sodium: 145 mmol/L — ABNORMAL HIGH (ref 134–144)
TOTAL PROTEIN: 6.8 g/dL (ref 6.0–8.5)

## 2016-04-19 LAB — CBC WITH DIFFERENTIAL/PLATELET
BASOS ABS: 0 10*3/uL (ref 0.0–0.2)
Basos: 1 %
EOS (ABSOLUTE): 0.2 10*3/uL (ref 0.0–0.4)
Eos: 3 %
HEMOGLOBIN: 14.3 g/dL (ref 11.1–15.9)
Hematocrit: 43.2 % (ref 34.0–46.6)
IMMATURE GRANS (ABS): 0 10*3/uL (ref 0.0–0.1)
IMMATURE GRANULOCYTES: 0 %
LYMPHS: 40 %
Lymphocytes Absolute: 2.3 10*3/uL (ref 0.7–3.1)
MCH: 30 pg (ref 26.6–33.0)
MCHC: 33.1 g/dL (ref 31.5–35.7)
MCV: 91 fL (ref 79–97)
MONOCYTES: 9 %
Monocytes Absolute: 0.5 10*3/uL (ref 0.1–0.9)
Neutrophils Absolute: 2.7 10*3/uL (ref 1.4–7.0)
Neutrophils: 47 %
Platelets: 249 10*3/uL (ref 150–379)
RBC: 4.76 x10E6/uL (ref 3.77–5.28)
RDW: 14.3 % (ref 12.3–15.4)
WBC: 5.7 10*3/uL (ref 3.4–10.8)

## 2016-04-19 LAB — HEPATITIS C ANTIBODY: Hep C Virus Ab: <0.1 s/co ratio (ref 0.0–0.9)

## 2016-04-19 LAB — PAP IG AND HPV HIGH-RISK
HPV, HIGH-RISK: NEGATIVE
PAP Smear Comment: 0

## 2016-04-19 LAB — TSH: TSH: 2.95 u[IU]/mL (ref 0.450–4.500)

## 2016-04-19 LAB — HEMOGLOBIN A1C
Est. average glucose Bld gHb Est-mCnc: 111 mg/dL
Hgb A1c MFr Bld: 5.5 % (ref 4.8–5.6)

## 2016-04-19 NOTE — Telephone Encounter (Signed)
-----   Message from Margaretann LovelessJennifer M Burnette, [place]-C sent at 04/19/2016  1:17 PM EST ----- All labs are within normal limits and stable with exception of cholesterol which is elevated at 270 and LDL is 178. HDL (good) cholesterol is elevated at 73 and offers cardioprotection. Continue healthy lifestyle modifications. Pap is also normal, HPV negative. Can repeat pap in 3-5 years.  Thanks! -JB

## 2016-04-19 NOTE — Telephone Encounter (Signed)
Patient advised as below. Patient verbalizes understanding and is in agreement with treatment plan.  

## 2016-04-20 ENCOUNTER — Other Ambulatory Visit: Payer: Self-pay

## 2016-04-20 MED ORDER — PROGESTERONE MICRONIZED 200 MG PO CAPS: 200.0000 mg | ORAL_CAPSULE | Freq: Every day | ORAL | 5 refills | Status: DC

## 2016-04-21 ENCOUNTER — Telehealth: Payer: Self-pay

## 2016-04-21 ENCOUNTER — Other Ambulatory Visit: Payer: Self-pay | Admitting: *Deleted

## 2016-04-21 ENCOUNTER — Inpatient Hospital Stay
Admission: RE | Admit: 2016-04-21 | Discharge: 2016-04-21 | Disposition: A | Discharge status: Home / Self Care | Payer: Self-pay | Source: Ambulatory Visit | Attending: *Deleted | Admitting: *Deleted

## 2016-04-21 DIAGNOSIS — Z9289 Personal history of other medical treatment: Secondary | ICD-10-CM

## 2016-04-21 NOTE — Telephone Encounter (Signed)
-----   Message from Margaretann LovelessJennifer M Burnette, [place]C sent at 04/21/2016  4:15 PM EST ----- Normal mammogram. Repeat screening in one year.

## 2016-04-21 NOTE — Telephone Encounter (Signed)
[id number] 

## 2016-04-24 NOTE — Telephone Encounter (Signed)
Patient has been advised. KW 

## 2016-05-17 ENCOUNTER — Other Ambulatory Visit: Payer: Self-pay

## 2016-05-17 MED ORDER — PROGESTERONE MICRONIZED 200 MG PO CAPS: 200.0000 mg | ORAL_CAPSULE | Freq: Every day | ORAL | 5 refills | Status: DC

## 2016-05-18 NOTE — Telephone Encounter (Signed)
Called into [facility]. Allene DillonEmily Drozdowski, CMA

## 2016-05-22 ENCOUNTER — Other Ambulatory Visit: Payer: Self-pay | Admitting: Physician Assistant

## 2016-05-22 MED ORDER — PROGESTERONE MICRONIZED 200 MG PO CAPS: 200.0000 mg | ORAL_CAPSULE | Freq: Every day | ORAL | 5 refills | Status: DC

## 2016-05-22 NOTE — Progress Notes (Signed)
Faxed form to increase to 225mg  progesterone

## 2016-05-23 IMAGING — MR MR CERVICAL SPINE WO/W CM
8 series · 44 of 48 positions shown · IV contrast (14ml Multihance)
Comparison: None.

CLINICAL DATA: Status post fall 2 weeks ago. Possible cervical
spine abnormality by CT scan.

EXAM:
MRI CERVICAL SPINE WITHOUT AND WITH CONTRAST
TECHNIQUE: Multiplanar and multiecho pulse sequences of the cervical spine, to
include the craniocervical junction and cervicothoracic junction,
were obtained according to standard protocol without and with
intravenous contrast.
CONTRAST:  15 ml MULTIHANCE GADOBENATE DIMEGLUMINE 529 MG/ML IV SOLN

[Series 3: T2 · sagittal · 3.0mm · 0.70mm/px · 4 of 13 slices shown (1 of 2)]
[im 1/13]
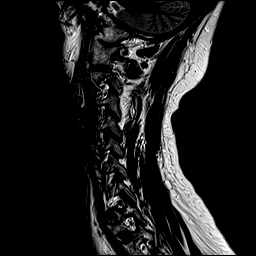
[im 5/13]
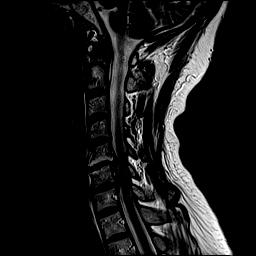
[im 9/13]
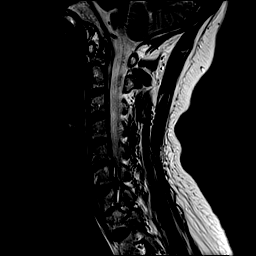
[im 13/13]
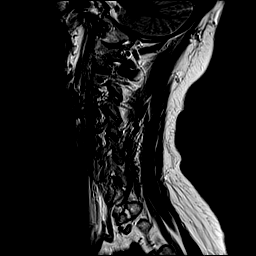

[Series 4: T1 · sagittal · 3.0mm · 0.70mm/px · 4 of 13 slices shown (1 of 2)]
[im 1/13]
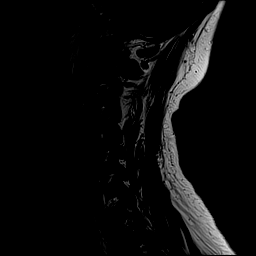
[im 5/13]
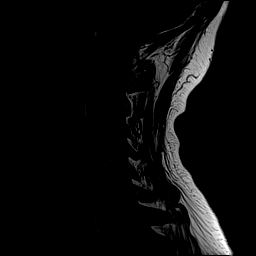
[im 9/13]
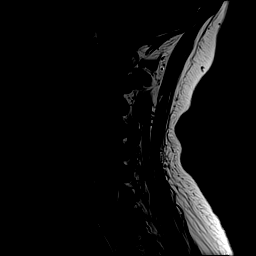
[im 13/13]
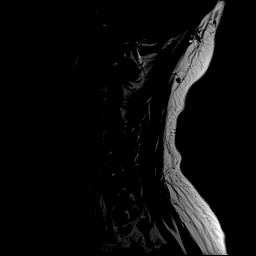

[Series 5: STIR · sagittal · 3.0mm · 0.70mm/px · 4 of 13 slices shown]
[im 1/13]
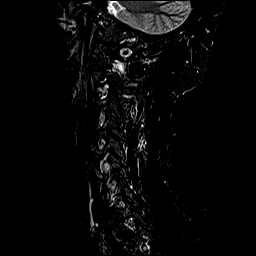
[im 5/13]
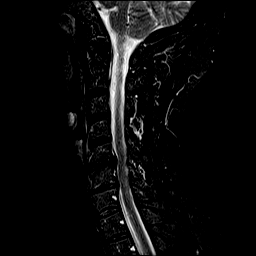
[im 9/13]
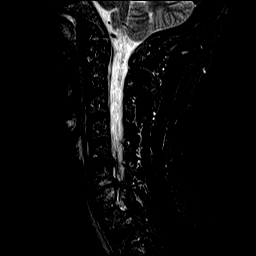
[im 13/13]
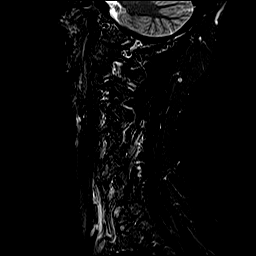

[Series 6: T2 · axial · 3.0mm · 0.70mm/px · z∈[-59,+45]mm · 8 of 28 slices shown (2 of 2)]
[im 1/28]
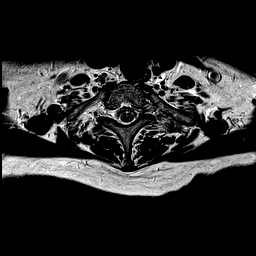
[im 4/28]
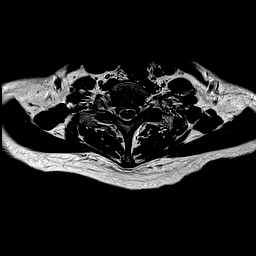
[im 8/28]
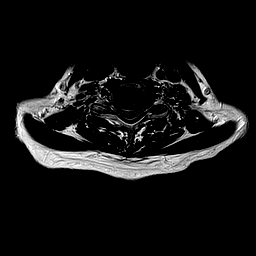
[im 12/28]
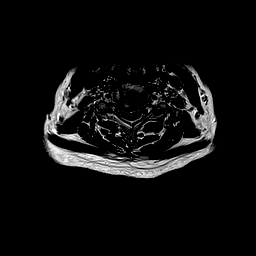
[im 16/28]
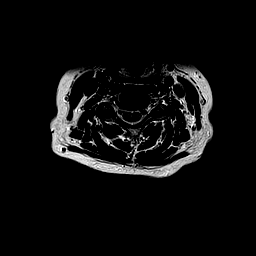
[im 20/28]
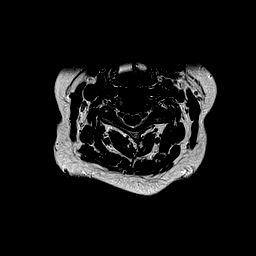
[im 24/28]
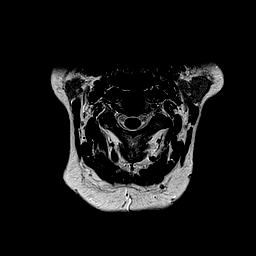
[im 28/28]
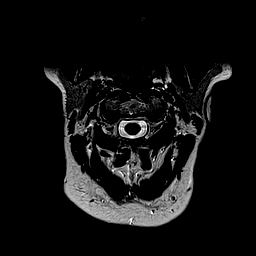

[Series 7: mpgr ax · axial · 3.0mm · 0.35mm/px · z∈[-59,-17]mm · 4 of 28 slices shown]
[im 1/28]
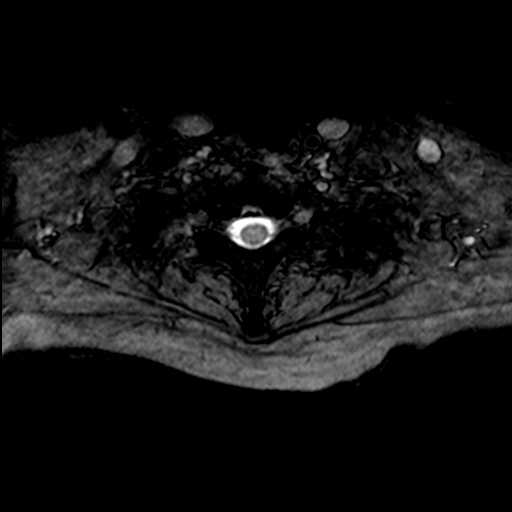
[im 4/28]
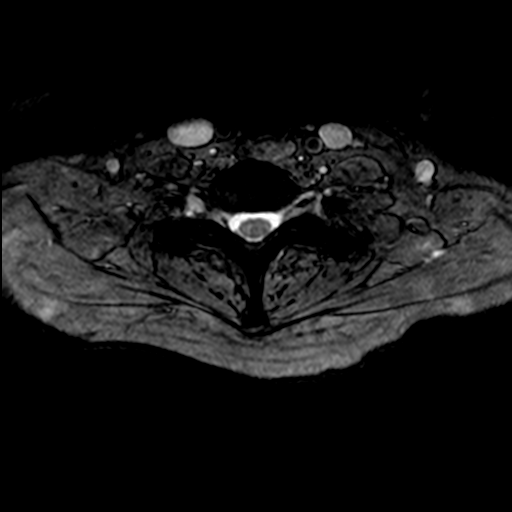
[im 8/28]
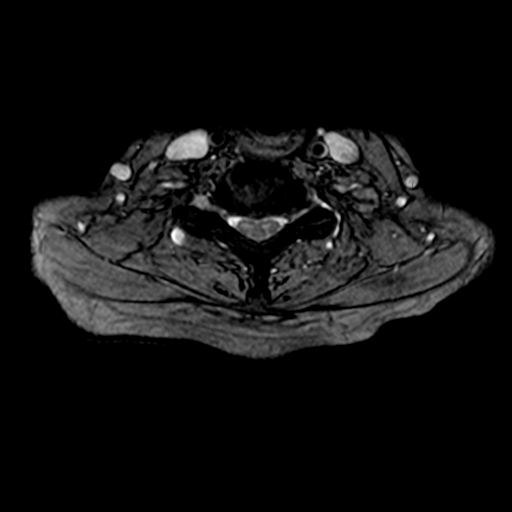
[im 12/28]
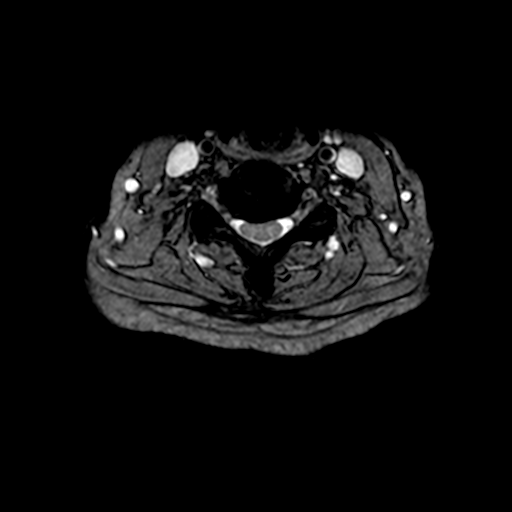

[Series 8: T1 · axial · 3.0mm · 0.56mm/px · z∈[-59,+45]mm · 8 of 28 slices shown (2 of 2)]
[im 1/28]
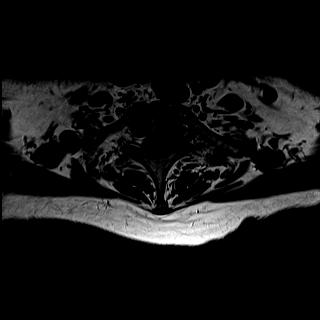
[im 4/28]
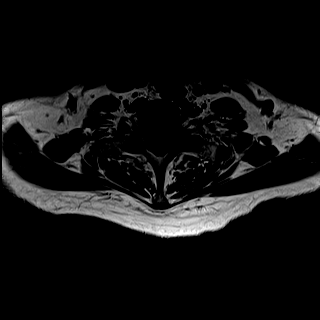
[im 8/28]
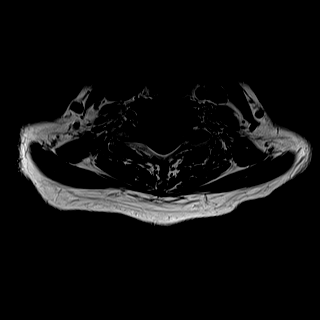
[im 12/28]
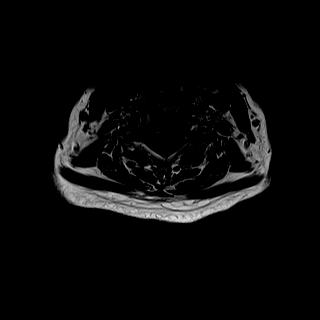
[im 16/28]
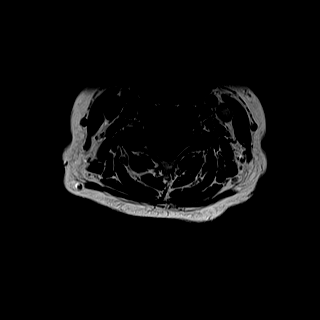
[im 20/28]
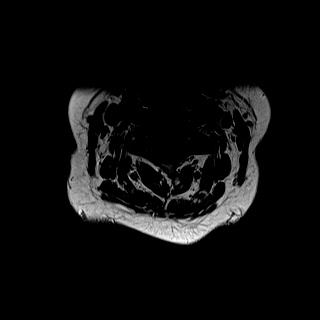
[im 24/28]
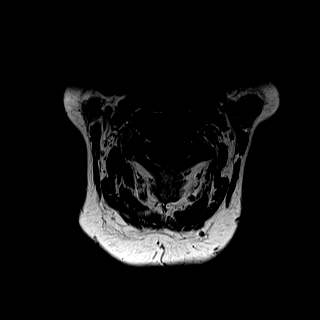
[im 28/28]
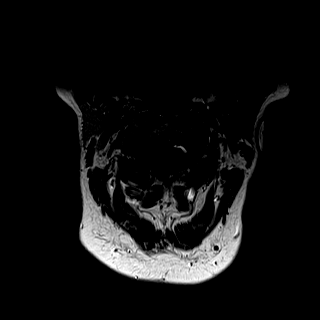

[Series 9: T1 fat-sat post-contrast · sagittal · 3.0mm · 0.70mm/px · 4 of 13 slices shown]
[im 1/13]
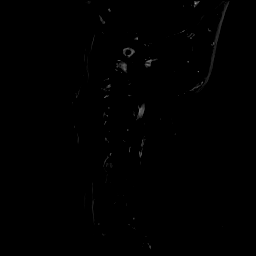
[im 5/13]
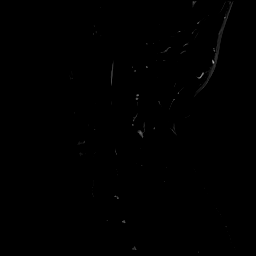
[im 9/13]
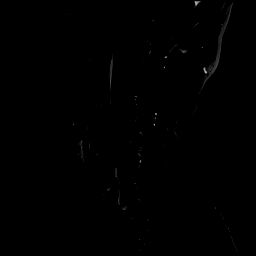
[im 13/13]
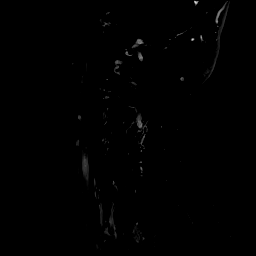

[Series 10: T1 post-contrast · axial · 3.0mm · 0.56mm/px · z∈[-59,+45]mm · 8 of 28 slices shown]
[im 1/28]
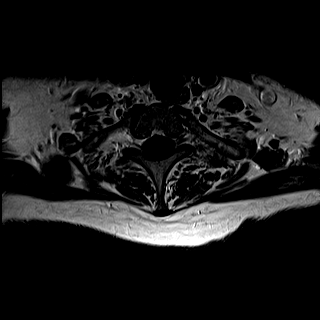
[im 4/28]
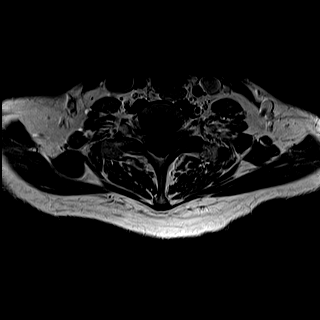
[im 8/28]
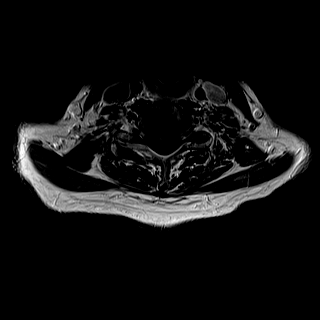
[im 12/28]
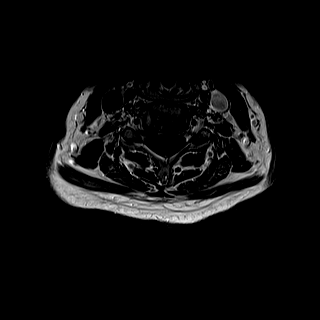
[im 16/28]
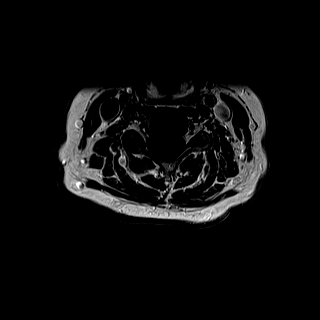
[im 20/28]
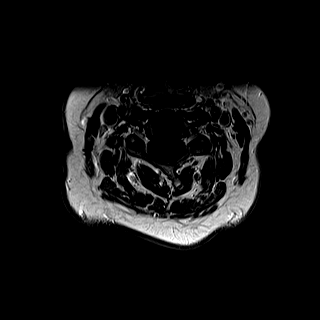
[im 24/28]
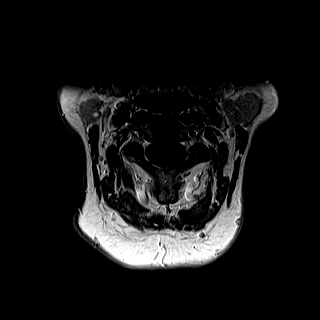
[im 28/28]
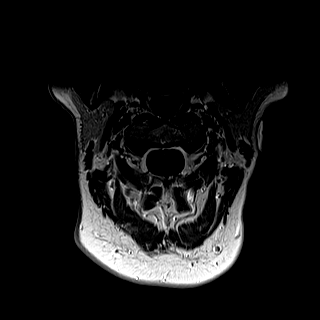

[44 of 48 positions shown; findings below may reference images not displayed]

FINDINGS: The patient's CT scan is not available. Vertebral body height and
alignment are maintained. Mild degenerative endplate signal change
and associated postcontrast enhancement are seen eccentric to the
left at C6-7. The craniocervical junction is normal. Cervical cord
signal is normal. Imaged paraspinous structures are unremarkable.

C2-3:  Negative.

C3-4: The patient has a small central disc protrusion. The disc
contacts the cord without deforming it. The central canal and
foramina remain widely patent.

C4-5:  Mild disc bulge without central canal or foraminal narrowing.

C6-7: The patient has a small disc osteophyte complex and
uncovertebral disease. There is mild deformity of the cord, more
notable on the right. The foramina appear open.

C6-7: The patient has a disc osteophyte complex and left worse than
right uncovertebral disease. There is mild deformity of the ventral
cord. Mild to moderate foraminal narrowing appears worse on the
left.

C7-T1:  Negative.
IMPRESSION: Cervical spondylosis most notable at C5-6 where there mild deformity
of the cord, more prominent on the right, due to a disc osteophyte
complex. The foramina appear open at this level.

Mild deformity of the ventral cord at C6-7 due to a disc osteophyte
complex. Uncovertebral disease causes mild to moderate foraminal
narrowing at this level, worse on the left.

Mild disc bulge C4-5 without central canal stenosis.

Small central protrusion at C3-4 contacts the cord without deforming
it. The central canal and foramina are open.

## 2016-06-27 ENCOUNTER — Ambulatory Visit (INDEPENDENT_AMBULATORY_CARE_PROVIDER_SITE_OTHER): Payer: BC Managed Care – PPO | Admitting: Physician Assistant

## 2016-06-27 ENCOUNTER — Encounter: Payer: Self-pay | Admitting: Physician Assistant

## 2016-06-27 VITALS — BP 100/70 | HR 68 | Temp 98.0°F | Resp 16 | Wt 175.0 lb

## 2016-06-27 DIAGNOSIS — R05 Cough: Secondary | ICD-10-CM

## 2016-06-27 DIAGNOSIS — J069 Acute upper respiratory infection, unspecified: Secondary | ICD-10-CM | POA: Diagnosis not present

## 2016-06-27 DIAGNOSIS — R059 Cough, unspecified: Secondary | ICD-10-CM

## 2016-06-27 DIAGNOSIS — R55 Syncope and collapse: Secondary | ICD-10-CM | POA: Diagnosis not present

## 2016-06-27 MED ORDER — ESCITALOPRAM OXALATE 10 MG PO TABS
15.0000 mg | ORAL_TABLET | Freq: Every day | ORAL | 5 refills | Status: AC
Start: 2016-06-27 — End: ?

## 2016-06-27 MED ORDER — AZITHROMYCIN 250 MG PO TABS: ORAL_TABLET | ORAL | 0 refills | Status: DC

## 2016-06-27 MED ORDER — HYDROCODONE-HOMATROPINE 5-1.5 MG/5ML PO SYRP: 5.0000 mL | ORAL_SOLUTION | Freq: Three times a day (TID) | ORAL | 0 refills | Status: AC | PRN

## 2016-06-27 NOTE — Patient Instructions (Signed)
Upper Respiratory Infection, Adult Most upper respiratory infections (URIs) are a viral infection of the air passages leading to the lungs. A URI affects the nose, throat, and upper air passages. The most common type of URI is nasopharyngitis and is typically referred to as "the common cold." URIs run their course and usually go away on their own. Most of the time, a URI does not require medical attention, but sometimes a bacterial infection in the upper airways can follow a viral infection. This is called a secondary infection. Sinus and middle ear infections are common types of secondary upper respiratory infections. Bacterial pneumonia can also complicate a URI. A URI can worsen asthma and chronic obstructive pulmonary disease (COPD). Sometimes, these complications can require emergency medical care and may be life threatening. What are the causes? Almost all URIs are caused by viruses. A virus is a type of germ and can spread from one person to another. What increases the risk? You may be at risk for a URI if:  You smoke.  You have chronic heart or lung disease.  You have a weakened defense (immune) system.  You are very young or very old.  You have nasal allergies or asthma.  You work in crowded or poorly ventilated areas.  You work in health care facilities or schools.  What are the signs or symptoms? Symptoms typically develop 2-3 days after you come in contact with a cold virus. Most viral URIs last 7-10 days. However, viral URIs from the influenza virus (flu virus) can last 14-18 days and are typically more severe. Symptoms may include:  Runny or stuffy (congested) nose.  Sneezing.  Cough.  Sore throat.  Headache.  Fatigue.  Fever.  Loss of appetite.  Pain in your forehead, behind your eyes, and over your cheekbones (sinus pain).  Muscle aches.  How is this diagnosed? Your health care provider may diagnose a URI by:  Physical exam.  Tests to check that your  symptoms are not due to another condition such as: ? Strep throat. ? Sinusitis. ? Pneumonia. ? Asthma.  How is this treated? A URI goes away on its own with time. It cannot be cured with medicines, but medicines may be prescribed or recommended to relieve symptoms. Medicines may help:  Reduce your fever.  Reduce your cough.  Relieve nasal congestion.  Follow these instructions at home:  Take medicines only as directed by your health care provider.  Gargle warm saltwater or take cough drops to comfort your throat as directed by your health care provider.  Use a warm mist humidifier or inhale steam from a shower to increase air moisture. This may make it easier to breathe.  Drink enough fluid to keep your urine clear or pale yellow.  Eat soups and other clear broths and maintain good nutrition.  Rest as needed.  Return to work when your temperature has returned to normal or as your health care provider advises. You may need to stay home longer to avoid infecting others. You can also use a face mask and careful hand washing to prevent spread of the virus.  Increase the usage of your inhaler if you have asthma.  Do not use any tobacco products, including cigarettes, chewing tobacco, or electronic cigarettes. If you need help quitting, ask your health care provider. How is this prevented? The best way to protect yourself from getting a cold is to practice good hygiene.  Avoid oral or hand contact with people with cold symptoms.  Wash your   hands often if contact occurs.  There is no clear evidence that vitamin C, vitamin E, echinacea, or exercise reduces the chance of developing a cold. However, it is always recommended to get plenty of rest, exercise, and practice good nutrition. Contact a health care provider if:  You are getting worse rather than better.  Your symptoms are not controlled by medicine.  You have chills.  You have worsening shortness of breath.  You have  brown or red mucus.  You have yellow or brown nasal discharge.  You have pain in your face, especially when you bend forward.  You have a fever.  You have swollen neck glands.  You have pain while swallowing.  You have white areas in the back of your throat. Get help right away if:  You have severe or persistent: ? Headache. ? Ear pain. ? Sinus pain. ? Chest pain.  You have chronic lung disease and any of the following: ? Wheezing. ? Prolonged cough. ? Coughing up blood. ? A change in your usual mucus.  You have a stiff neck.  You have changes in your: ? Vision. ? Hearing. ? Thinking. ? Mood. This information is not intended to replace advice given to you by your health care provider. Make sure you discuss any questions you have with your health care provider. Document Released: 11/15/2000 Document Revised: 01/23/2016 Document Reviewed: 08/27/2013 Elsevier Interactive Patient Education  2017 Elsevier Inc.  

## 2016-06-27 NOTE — Progress Notes (Signed)
Patient: Brittany Glover Female    DOB: 10-17-1960   56 y.o.   MRN: [id number] Visit Date: 06/27/2016  Today's Provider: Margaretann Loveless, PA-C   Chief Complaint  Patient presents with  . URI   Subjective:    HPI Upper Respiratory Infection: Patient complains of symptoms of a URI. Symptoms include congestion, cough and swollen glands. Onset of symptoms was 5 days ago, gradually worsening since that time. She also c/o achiness, congestion, nasal congestion and productive cough with  mucoid colored sputum for the past 2 day .  She is drinking plenty of fluids. Evaluation to date: none. Treatment to date: cough suppressants and decongestants.     No Known Allergies   Current Outpatient Prescriptions:  .  escitalopram (LEXAPRO) 10 MG tablet, Take 1.5 tablets (15 mg total) by mouth daily., Disp: 45 tablet, Rfl: 5 .  estradiol (MINIVELLE) 0.025 MG/24HR, Place 1 patch onto the skin 2 (two) times a week., Disp: 8 patch, Rfl: 12 .  levothyroxine (SYNTHROID, LEVOTHROID) 88 MCG tablet, 88 mcg daily before breakfast. , Disp: , Rfl:  .  progesterone (PROMETRIUM) 200 MG capsule, Take 1 capsule (200 mg total) by mouth daily. 6 days per week, Disp: 30 capsule, Rfl: 5  Review of Systems  Constitutional: Positive for fatigue. Negative for fever.  HENT: Positive for congestion, postnasal drip and sore throat. Negative for ear pain, rhinorrhea, sinus pain and sinus pressure.   Respiratory: Positive for cough.   Cardiovascular: Negative for chest pain, palpitations and leg swelling.  Gastrointestinal: Negative for abdominal pain.  Neurological: Positive for dizziness and headaches.    Social History  Substance Use Topics  . Smoking status: Former Smoker    Packs/day: 0.50    Years: 20.00    Types: Cigarettes    Quit date: 10/13/2004  . Smokeless tobacco: Never Used  . Alcohol use No     Comment: rare   Objective:   BP 100/70 (BP Location: Right Arm, Patient Position: Sitting,  Cuff Size: Large)   Pulse 68   Temp 98 F (36.7 C) (Oral)   Resp 16   Wt 175 lb (79.4 kg)   SpO2 98%   BMI 30.04 kg/m   Physical Exam  Constitutional: She appears well-developed and well-nourished. No distress.  HENT:  Head: Normocephalic and atraumatic.  Right Ear: Hearing, tympanic membrane, external ear and ear canal normal.  Left Ear: Hearing, tympanic membrane, external ear and ear canal normal.  Nose: Mucosal edema and rhinorrhea present. Right sinus exhibits no maxillary sinus tenderness and no frontal sinus tenderness. Left sinus exhibits no maxillary sinus tenderness and no frontal sinus tenderness.  Mouth/Throat: Uvula is midline and mucous membranes are normal. Posterior oropharyngeal erythema present. No oropharyngeal exudate or posterior oropharyngeal edema.  Eyes: Conjunctivae are normal. Pupils are equal, round, and reactive to light. Right eye exhibits no discharge. Left eye exhibits no discharge. No scleral icterus.  Neck: Normal range of motion. Neck supple. No tracheal deviation present. No thyromegaly present.  Cardiovascular: Normal rate, regular rhythm and normal heart sounds.  Exam reveals no gallop and no friction rub.   No murmur heard. Pulmonary/Chest: Effort normal and breath sounds normal. No stridor. No respiratory distress. She has no decreased breath sounds. She has no wheezes. She has no rhonchi. She has no rales.  Lymphadenopathy:    She has no cervical adenopathy.  Skin: Skin is warm and dry. She is not diaphoretic.  Vitals reviewed.  Assessment & Plan:     1. Upper respiratory tract infection, unspecified type Worsening symptoms that s not responding to OTC medications. Will give zpak as below. She is to call if symptoms worsen or fail to improve. - azithromycin (ZITHROMAX) 250 MG tablet; Take 2 tablets PO on day one, and one tablet PO daily thereafter until completed.  Dispense: 6 tablet; Refill: 0  2. Cough Worsening symptoms that has not  responded to OTC medications. Will give Hycodan cough syrup as below for nighttime cough. Drowsiness precautions given to patient. Stay well hydrated. Use delsym, robitussin OR mucinex for daytime cough. - HYDROcodone-homatropine (HYCODAN) 5-1.5 MG/5ML syrup; Take 5 mLs by mouth every 8 (eight) hours as needed for cough.  Dispense: 180 mL; Refill: 0  3. Vasomotor instability Stable. Diagnosis pulled for medication refill. Continue current medical treatment plan. - escitalopram (LEXAPRO) 10 MG tablet; Take 1.5 tablets (15 mg total) by mouth daily.  Dispense: 45 tablet; Refill: 5       Margaretann LovelessJennifer M Bauer Ausborn, PA-C  [facility]

## 2016-06-28 ENCOUNTER — Telehealth: Payer: Self-pay

## 2016-06-28 MED ORDER — AMOXICILLIN-POT CLAVULANATE 875-125 MG PO TABS
1.0000 | ORAL_TABLET | Freq: Two times a day (BID) | ORAL | 0 refills | Status: DC
Start: 2016-06-28 — End: 2016-10-19

## 2016-06-28 NOTE — Telephone Encounter (Signed)
Advised patient as below.  

## 2016-06-28 NOTE — Telephone Encounter (Signed)
Patient called saying that she was seen in the office yesterday (06/27/2016) and was treated for a URI. She reports that she was prescribed a Zpak and the Hydocan cough syrup. She reports that this morning, she developed a rash on both of her arms that is extremely itchy and red. Patient reports that she took 2 tablets of the abx yesterday, and 1 tablet this morning. She has also taken 1 dose of the cough syrup last night.   Patient wants to know should she continue taking the medications? If not, will you prescribe something different? Also, what can she take for the rash?  Patient denies any shortness of breath, wheezing, sensation of throat closing, or chest pain. [facility]. Advised patient if any of her symptoms worsen, she needs to go to there ER.

## 2016-06-28 NOTE — Telephone Encounter (Signed)
She should discontinue zpak and I will send in another antibiotic for her. She can use benadryl and zantac 150mg  for rash. Call or return if symptoms worsen.

## 2016-07-19 ENCOUNTER — Other Ambulatory Visit: Payer: Self-pay

## 2016-07-19 DIAGNOSIS — N951 Menopausal and female climacteric states: Secondary | ICD-10-CM

## 2016-07-19 MED ORDER — PROGESTERONE MICRONIZED 200 MG PO CAPS: ORAL_CAPSULE | ORAL | 5 refills | Status: DC

## 2016-07-19 NOTE — Telephone Encounter (Signed)
Medication refill from [facility] pharmacy  Medication: Progesterone 225MG  SR CAP QTY:30

## 2016-07-19 NOTE — Telephone Encounter (Signed)
Prometrium 225mg  SR capsule refill request sent to [facility].

## 2016-08-24 ENCOUNTER — Telehealth: Payer: Self-pay | Admitting: Physician Assistant

## 2016-08-24 DIAGNOSIS — E039 Hypothyroidism, unspecified: Secondary | ICD-10-CM

## 2016-08-24 NOTE — Telephone Encounter (Signed)
Pt stated that [facility] was [facility] but she would like to start seeing Dr[facility] Sharin GraveSam Morayati [facility] Fax# [phone]. Pt stated that she needs a referral sent to that office attention Ladona Ridgelaylor before they will schedule an appt. Pt is requesting that Antony ContrasJenni send a referral. Please advise. Thanks TNP

## 2016-08-24 NOTE — Telephone Encounter (Signed)
Please review

## 2016-08-24 NOTE — Telephone Encounter (Signed)
Patient advised as below.  

## 2016-08-24 NOTE — Telephone Encounter (Signed)
Per patient request referral placed to Dr. Patrecia PaceMorayati.

## 2016-09-20 ENCOUNTER — Other Ambulatory Visit: Payer: Self-pay | Admitting: Physician Assistant

## 2016-09-20 ENCOUNTER — Encounter: Payer: Self-pay | Admitting: Physician Assistant

## 2016-09-20 DIAGNOSIS — N951 Menopausal and female climacteric states: Secondary | ICD-10-CM

## 2016-09-20 MED ORDER — ESTRADIOL 0.0375 MG/24HR TD PTTW: 1.0000 | MEDICATED_PATCH | TRANSDERMAL | 12 refills | Status: DC

## 2016-09-20 NOTE — Progress Notes (Signed)
Hormones monitored by Dr. Patrecia Pace and Rosanne Ashing, PhD at [facility]. Request to increase minivelle dose to 0.0375mg  patch for low energy and weight gain. Note will be scanned in to chart.

## 2016-10-19 ENCOUNTER — Encounter: Payer: Self-pay | Admitting: Physician Assistant

## 2016-10-19 ENCOUNTER — Ambulatory Visit (INDEPENDENT_AMBULATORY_CARE_PROVIDER_SITE_OTHER): Payer: BC Managed Care – PPO | Admitting: Physician Assistant

## 2016-10-19 VITALS — BP 120/68 | HR 94 | Temp 98.2°F | Resp 16 | Wt 186.2 lb

## 2016-10-19 DIAGNOSIS — J040 Acute laryngitis: Secondary | ICD-10-CM

## 2016-10-19 DIAGNOSIS — J069 Acute upper respiratory infection, unspecified: Secondary | ICD-10-CM | POA: Diagnosis not present

## 2016-10-19 MED ORDER — AMOXICILLIN-POT CLAVULANATE 875-125 MG PO TABS: 1.0000 | ORAL_TABLET | Freq: Two times a day (BID) | ORAL | 0 refills | Status: AC

## 2016-10-19 NOTE — Patient Instructions (Signed)
Upper Respiratory Infection, Adult Most upper respiratory infections (URIs) are a viral infection of the air passages leading to the lungs. A URI affects the nose, throat, and upper air passages. The most common type of URI is nasopharyngitis and is typically referred to as "the common cold." URIs run their course and usually go away on their own. Most of the time, a URI does not require medical attention, but sometimes a bacterial infection in the upper airways can follow a viral infection. This is called a secondary infection. Sinus and middle ear infections are common types of secondary upper respiratory infections. Bacterial pneumonia can also complicate a URI. A URI can worsen asthma and chronic obstructive pulmonary disease (COPD). Sometimes, these complications can require emergency medical care and may be life threatening. What are the causes? Almost all URIs are caused by viruses. A virus is a type of germ and can spread from one person to another. What increases the risk? You may be at risk for a URI if:  You smoke.  You have chronic heart or lung disease.  You have a weakened defense (immune) system.  You are very young or very old.  You have nasal allergies or asthma.  You work in crowded or poorly ventilated areas.  You work in health care facilities or schools.  What are the signs or symptoms? Symptoms typically develop 2-3 days after you come in contact with a cold virus. Most viral URIs last 7-10 days. However, viral URIs from the influenza virus (flu virus) can last 14-18 days and are typically more severe. Symptoms may include:  Runny or stuffy (congested) nose.  Sneezing.  Cough.  Sore throat.  Headache.  Fatigue.  Fever.  Loss of appetite.  Pain in your forehead, behind your eyes, and over your cheekbones (sinus pain).  Muscle aches.  How is this diagnosed? Your health care provider may diagnose a URI by:  Physical exam.  Tests to check that your  symptoms are not due to another condition such as: ? Strep throat. ? Sinusitis. ? Pneumonia. ? Asthma.  How is this treated? A URI goes away on its own with time. It cannot be cured with medicines, but medicines may be prescribed or recommended to relieve symptoms. Medicines may help:  Reduce your fever.  Reduce your cough.  Relieve nasal congestion.  Follow these instructions at home:  Take medicines only as directed by your health care provider.  Gargle warm saltwater or take cough drops to comfort your throat as directed by your health care provider.  Use a warm mist humidifier or inhale steam from a shower to increase air moisture. This may make it easier to breathe.  Drink enough fluid to keep your urine clear or pale yellow.  Eat soups and other clear broths and maintain good nutrition.  Rest as needed.  Return to work when your temperature has returned to normal or as your health care provider advises. You may need to stay home longer to avoid infecting others. You can also use a face mask and careful hand washing to prevent spread of the virus.  Increase the usage of your inhaler if you have asthma.  Do not use any tobacco products, including cigarettes, chewing tobacco, or electronic cigarettes. If you need help quitting, ask your health care provider. How is this prevented? The best way to protect yourself from getting a cold is to practice good hygiene.  Avoid oral or hand contact with people with cold symptoms.  Wash your   occurs. There is no clear evidence that vitamin C, vitamin E, echinacea, or exercise reduces the chance of developing a cold. However, it is always recommended to get plenty of rest, exercise, and practice good nutrition. Contact a health care provider if:  You are getting worse rather than better.  Your symptoms are not controlled by medicine.  You have chills.  You have worsening shortness of breath.  You have brown  or red mucus.  You have yellow or brown nasal discharge.  You have pain in your face, especially when you bend forward.  You have a fever.  You have swollen neck glands.  You have pain while swallowing.  You have white areas in the back of your throat. Get help right away if:  You have severe or persistent:  Headache.  Ear pain.  Sinus pain.  Chest pain.  You have chronic lung disease and any of the following:  Wheezing.  Prolonged cough.  Coughing up blood.  A change in your usual mucus.  You have a stiff neck.  You have changes in your:  Vision.  Hearing.  Thinking.  Mood. This information is not intended to replace advice given to you by your health care provider. Make sure you discuss any questions you have with your health care provider. Document Released: 11/15/2000 Document Revised: 01/23/2016 Document Reviewed: 08/27/2013 Elsevier Interactive Patient Education  2017 Elsevier Inc. Laryngitis Laryngitis is inflammation of your vocal cords. This causes hoarseness, coughing, loss of voice, sore throat, or a dry throat. Your vocal cords are two bands of muscles that are found in your throat. When you speak, these cords come together and vibrate. These vibrations come out through your mouth as sound. When your vocal cords are inflamed, your voice sounds different. Laryngitis can be temporary (acute) or long-term (chronic). Most cases of acute laryngitis improve with time. Chronic laryngitis is laryngitis that lasts for more than three weeks. What are the causes? Acute laryngitis may be caused by:  A viral infection.  Lots of talking, yelling, or singing. This is also called vocal strain.  Bacterial infections. Chronic laryngitis may be caused by:  Vocal strain.  Injury to your vocal cords.  Acid reflux (gastroesophageal reflux disease or GERD).  Allergies.  Sinus infection.  Smoking.  Alcohol abuse.  Breathing in chemicals or  dust.  Growths on the vocal cords. What increases the risk? Risk factors for laryngitis include:  Smoking.  Alcohol abuse.  Having allergies. What are the signs or symptoms? Symptoms of laryngitis may include:  Low, hoarse voice.  Loss of voice.  Dry cough.  Sore throat.  Stuffy nose. How is this diagnosed? Laryngitis may be diagnosed by:  Physical exam.  Throat culture.  Blood test.  Laryngoscopy. This procedure allows your health care provider to look at your vocal cords with a mirror or viewing tube. How is this treated? Treatment for laryngitis depends on what is causing it. Usually, treatment involves resting your voice and using medicines to soothe your throat. However, if your laryngitis is caused by a bacterial infection, you may need to take antibiotic medicine. If your laryngitis is caused by a growth, you may need to have a procedure to remove it. Follow these instructions at home:  Drink enough fluid to keep your urine clear or pale yellow.  Breathe in moist air. Use a humidifier if you live in a dry climate.  Take medicines only as directed by your health care provider.  If you were prescribed an  antibiotic medicine, finish it all even if you start to feel better.  Do not smoke cigarettes or electronic cigarettes. If you need help quitting, ask your health care provider.  Talk as little as possible. Also avoid whispering, which can cause vocal strain.  Write instead of talking. Do this until your voice is back to normal. Contact a health care provider if:  You have a fever.  You have increasing pain.  You have difficulty swallowing. Get help right away if:  You cough up blood.  You have trouble breathing. This information is not intended to replace advice given to you by your health care provider. Make sure you discuss any questions you have with your health care provider. Document Released: 05/22/2005 Document Revised: 10/28/2015 Document  Reviewed: 11/04/2013 Elsevier Interactive Patient Education  2017 [employer]

## 2016-10-19 NOTE — Progress Notes (Signed)
Patient: Brittany Glover Female    DOB: 05-Apr-1961   56 y.o.   MRN: [id number] Visit Date: 10/19/2016  Today's Provider: Margaretann Loveless, PA-C   Chief Complaint  Patient presents with  . URI   Subjective:    URI   This is a new problem. The current episode started in the past 7 days (Started on Sunday with the cough). The problem has been unchanged. There has been no fever. Associated symptoms include congestion, coughing, ear pain (right ear pain), headaches, nausea, sinus pain and sneezing. Pertinent negatives include no abdominal pain, chest pain, diarrhea, plugged ear sensation, rhinorrhea, sore throat, vomiting or wheezing. She has tried decongestant (Sudafed and Hycodan Syrup from previous visit) for the symptoms. The treatment provided mild relief.      No Known Allergies   Current Outpatient Prescriptions:  .  escitalopram (LEXAPRO) 10 MG tablet, Take 1.5 tablets (15 mg total) by mouth daily., Disp: 45 tablet, Rfl: 5 .  estradiol (MINIVELLE) 0.0375 MG/24HR, Place 1 patch onto the skin 2 (two) times a week., Disp: 8 patch, Rfl: 12 .  HYDROcodone-homatropine (HYCODAN) 5-1.5 MG/5ML syrup, Take 5 mLs by mouth every 8 (eight) hours as needed for cough., Disp: 180 mL, Rfl: 0 .  levothyroxine (SYNTHROID, LEVOTHROID) 88 MCG tablet, 88 mcg daily before breakfast. , Disp: , Rfl:  .  progesterone (PROMETRIUM) 200 MG capsule, Take 225mg  SR capsule PO daily, Disp: 30 capsule, Rfl: 5 .  amoxicillin-clavulanate (AUGMENTIN) 875-125 MG tablet, Take 1 tablet by mouth 2 (two) times daily. (Patient not taking: Reported on 10/19/2016), Disp: 14 tablet, Rfl: 0  Review of Systems  Constitutional: Negative for appetite change and fever.  HENT: Positive for congestion, ear pain (right ear pain), sinus pain, sneezing and voice change. Negative for postnasal drip, rhinorrhea and sore throat.   Eyes: Negative for discharge, redness and itching.  Respiratory: Positive for cough. Negative for  chest tightness, shortness of breath and wheezing.   Cardiovascular: Negative for chest pain, palpitations and leg swelling.  Gastrointestinal: Positive for nausea. Negative for abdominal pain, diarrhea and vomiting.  Neurological: Positive for headaches.    Social History  Substance Use Topics  . Smoking status: Former Smoker    Packs/day: 0.50    Years: 20.00    Types: Cigarettes    Quit date: 10/13/2004  . Smokeless tobacco: Never Used  . Alcohol use No     Comment: rare   Objective:   BP 120/68 (BP Location: Right Arm, Patient Position: Sitting, Cuff Size: Normal)   Pulse 94   Temp 98.2 F (36.8 C) (Oral)   Resp 16   Wt 186 lb 3.2 oz (84.5 kg)   SpO2 96%   BMI 31.96 kg/m  Vitals:   10/19/16 0958  BP: 120/68  Pulse: 94  Resp: 16  Temp: 98.2 F (36.8 C)  TempSrc: Oral  SpO2: 96%  Weight: 186 lb 3.2 oz (84.5 kg)     Physical Exam  Constitutional: She appears well-developed and well-nourished. No distress.  HENT:  Head: Normocephalic and atraumatic.  Right Ear: Hearing, tympanic membrane, external ear and ear canal normal.  Left Ear: Hearing, tympanic membrane, external ear and ear canal normal.  Nose: Nose normal.  Mouth/Throat: Uvula is midline, oropharynx is clear and moist and mucous membranes are normal. No oropharyngeal exudate.  Hoarse voice  Eyes: Conjunctivae are normal. Pupils are equal, round, and reactive to light. Right eye exhibits no discharge. Left eye exhibits  no discharge. No scleral icterus.  Neck: Normal range of motion. Neck supple. No tracheal deviation present. No thyromegaly present.  Cardiovascular: Normal rate, regular rhythm and normal heart sounds.  Exam reveals no gallop and no friction rub.   No murmur heard. Pulmonary/Chest: Effort normal and breath sounds normal. No stridor. No respiratory distress. She has no wheezes. She has no rales.  Lymphadenopathy:    She has no cervical adenopathy.  Skin: Skin is warm and dry. She is not  diaphoretic.  Vitals reviewed.       Assessment & Plan:     1. Upper respiratory tract infection, unspecified type Worsening symptoms that have not responded to OTC medications. Will give augmentin as below. Continue allergy medications. Stay well hydrated and get plenty of rest. Call if no symptom improvement or if symptoms worsen. - amoxicillin-clavulanate (AUGMENTIN) 875-125 MG tablet; Take 1 tablet by mouth 2 (two) times daily.  Dispense: 14 tablet; Refill: 0  2. Laryngitis Discussed voice rest, salt water gargles and tylenol prn. Call if no improvement.  - amoxicillin-clavulanate (AUGMENTIN) 875-125 MG tablet; Take 1 tablet by mouth 2 (two) times daily.  Dispense: 14 tablet; Refill: 0       Margaretann LovelessJennifer M Burnette, PA-C  [facility]

## 2016-11-06 ENCOUNTER — Other Ambulatory Visit: Payer: Self-pay | Admitting: Physician Assistant

## 2016-11-06 DIAGNOSIS — N951 Menopausal and female climacteric states: Secondary | ICD-10-CM

## 2016-11-07 MED ORDER — ESTRADIOL 0.05 MG/24HR TD PTTW: 1.0000 | MEDICATED_PATCH | TRANSDERMAL | 2 refills | Status: DC

## 2017-02-07 ENCOUNTER — Other Ambulatory Visit: Payer: Self-pay | Admitting: Physician Assistant

## 2017-02-07 DIAGNOSIS — N951 Menopausal and female climacteric states: Secondary | ICD-10-CM

## 2017-02-19 ENCOUNTER — Other Ambulatory Visit: Payer: Self-pay | Admitting: Physician Assistant

## 2017-02-19 DIAGNOSIS — N951 Menopausal and female climacteric states: Secondary | ICD-10-CM

## 2017-02-19 NOTE — Telephone Encounter (Signed)
[facility] faxed a request for the following medication. Thanks CC  progesterone (PROMETRIUM) 200 MG capsule  >Take one (1) capsule each day six days per week.

## 2017-02-20 ENCOUNTER — Other Ambulatory Visit: Payer: Self-pay | Admitting: Physician Assistant

## 2017-02-20 DIAGNOSIS — N951 Menopausal and female climacteric states: Secondary | ICD-10-CM

## 2017-02-20 MED ORDER — PROGESTERONE MICRONIZED 200 MG PO CAPS: ORAL_CAPSULE | ORAL | 2 refills | Status: AC

## 2017-02-20 MED ORDER — PROGESTERONE MICRONIZED 200 MG PO CAPS: ORAL_CAPSULE | ORAL | 2 refills | Status: DC

## 2017-02-20 NOTE — Telephone Encounter (Signed)
This a Brittany Glover patient.

## 2017-02-20 NOTE — Progress Notes (Signed)
Original order printed. Resent to [facility] for prometrium.

## 2017-02-26 DIAGNOSIS — E878 Other disorders of electrolyte and fluid balance, not elsewhere classified: Secondary | ICD-10-CM | POA: Insufficient documentation

## 2017-05-04 DIAGNOSIS — Z6831 Body mass index (BMI) 31.0-31.9, adult: Secondary | ICD-10-CM

## 2017-05-04 DIAGNOSIS — E6609 Other obesity due to excess calories: Secondary | ICD-10-CM | POA: Insufficient documentation

## 2017-05-04 DIAGNOSIS — E66811 Obesity, class 1: Secondary | ICD-10-CM | POA: Insufficient documentation

## 2017-05-04 DIAGNOSIS — Z79890 Hormone replacement therapy (postmenopausal): Secondary | ICD-10-CM | POA: Insufficient documentation

## 2017-05-04 DIAGNOSIS — Z87891 Personal history of nicotine dependence: Secondary | ICD-10-CM | POA: Insufficient documentation

## 2017-06-12 DIAGNOSIS — G4700 Insomnia, unspecified: Secondary | ICD-10-CM | POA: Insufficient documentation

## 2017-08-13 ENCOUNTER — Ambulatory Visit: Payer: BC Managed Care – PPO | Admitting: Internal Medicine

## 2017-08-13 ENCOUNTER — Encounter: Payer: Self-pay | Admitting: Internal Medicine

## 2017-08-13 VITALS — BP 132/76 | HR 79 | Resp 16 | Ht 64.0 in | Wt 191.6 lb

## 2017-08-13 DIAGNOSIS — G4710 Hypersomnia, unspecified: Secondary | ICD-10-CM | POA: Diagnosis not present

## 2017-08-13 DIAGNOSIS — M19049 Primary osteoarthritis, unspecified hand: Secondary | ICD-10-CM | POA: Insufficient documentation

## 2017-08-13 DIAGNOSIS — R0683 Snoring: Secondary | ICD-10-CM | POA: Diagnosis not present

## 2017-08-13 NOTE — Progress Notes (Signed)
[facility] [address] [place], [place] [address]  Pulmonary Sleep Medicine  Office Visit Note  Patient Name: Brittany ChickBelinda J Sura DOB: [birth year] MRN [id number]  Date of Service: 08/13/2017  Complaints/HPI:  Patient presents as a referral for evaluation of sleep apnea.  She states that she has excessive daytime somnolence.  She wakes up tired wakes up with headaches.  She has felt sleepy during the daytime.  She has felt tired 1 driving but has never actually fallen asleep.  She says that she can feel sleepy when she has a passenger.  She will follow sleep watching television.  She does have snoring.  Her husband also has obstructive sleep apnea and he was also concerned about her findings.  She has noted about a 60 lb weight gain and has noted that her symptoms have worsened since she has gained all the weight.  She has had some thyroid issues and she has Endocrinology for that.  She also has some female hormonal issues and is being treated by her primary for this.  No history of cardiac disease no history of chest pain no history of myocardial infarction.  ROS  General: (-) fever, (-) chills, (-) night sweats, (-) weakness Skin: (-) rashes, (-) itching,. Eyes: (-) visual changes, (-) redness, (-) itching. Nose and Sinuses: (-) nasal stuffiness or itchiness, (-) postnasal drip, (-) nosebleeds, (-) sinus trouble. Mouth and Throat: (-) sore throat, (-) hoarseness. Neck: (-) swollen glands, (-) enlarged thyroid, (-) neck pain. Respiratory: - cough, (-) bloody sputum, - shortness of breath, - wheezing. Cardiovascular: - ankle swelling, (-) chest pain. Lymphatic: (-) lymph node enlargement. Neurologic: (-) numbness, (-) tingling. Psychiatric: (-) anxiety, (-) depression   Current Medication: Outpatient Encounter Medications as of 08/13/2017  Medication Sig  . fluticasone (FLONASE) 50 MCG/ACT nasal spray 1 spray by Each Nare route daily.  Marland Kitchen. ibuprofen (ADVIL,MOTRIN) 600 MG  tablet ibuprofen 600 mg tablet  . levothyroxine (SYNTHROID, LEVOTHROID) 88 MCG tablet 88 mcg daily before breakfast.   . MINIVELLE 0.05 MG/24HR patch PLACE 1 PATCH ONTO THE SKIN TWO TIMES A WEEK  . progesterone (PROMETRIUM) 200 MG capsule Take 225mg  SR capsule PO daily  . Saline (AYR SALINE NASAL DROPS) 0.65 % (Soln) SOLN 2 drops by Each Nare route every four (4) hours as needed.  Marland Kitchen. amoxicillin-clavulanate (AUGMENTIN) 875-125 MG tablet Take 1 tablet by mouth 2 (two) times daily. (Patient not taking: Reported on 08/13/2017)  . cyclobenzaprine (FLEXERIL) 10 MG tablet cyclobenzaprine 10 mg tablet  Take 1 tablet 3 times a day by oral route.  Marland Kitchen. escitalopram (LEXAPRO) 10 MG tablet Take 1.5 tablets (15 mg total) by mouth daily. (Patient not taking: Reported on 08/13/2017)  . HYDROcodone-acetaminophen (NORCO/VICODIN) 5-325 MG tablet hydrocodone 5 mg-acetaminophen 325 mg tablet  . HYDROcodone-homatropine (HYCODAN) 5-1.5 MG/5ML syrup Take 5 mLs by mouth every 8 (eight) hours as needed for cough. (Patient not taking: Reported on 08/13/2017)  . meloxicam (MOBIC) 15 MG tablet meloxicam 15 mg tablet  Take 1 tablet every day by oral route with meals.  . misoprostol (CYTOTEC) 100 MCG tablet misoprostol 100 mcg tablet  . nystatin (MYCOSTATIN/NYSTOP) powder Apply to affected area 3 times daily  . Testosterone (TESTOPEL) 75 MG PLLT Inject into the skin.  Marland Kitchen. traZODone (DESYREL) 50 MG tablet Take 50 mg by mouth.  . traZODone (DESYREL) 50 MG tablet    No facility-administered encounter medications on file as of 08/13/2017.     Surgical History: Past Surgical History:  Procedure  Laterality Date  . CHOLECYSTECTOMY    . TONSILLECTOMY AND ADENOIDECTOMY  pt was 6  . TUBAL LIGATION      Medical History: Past Medical History:  Diagnosis Date  . Former smoker   . Premenopausal patient   . Thyroid disease    hypo  . Vitamin D deficiency     Family History: Family History  Problem Relation Age of Onset  .  Heart attack Father   . Irregular heart beat Father        Had a pacemaker  . Healthy Mother   . Breast cancer Neg Hx     Social History: Social History   Socioeconomic History  . Marital status: Married    Spouse name: Not on file  . Number of children: 2  . Years of education: Not on file  . Highest education level: Not on file  Social Needs  . Financial resource strain: Not on file  . Food insecurity - worry: Not on file  . Food insecurity - inability: Not on file  . Transportation needs - medical: Not on file  . Transportation needs - non-medical: Not on file  Occupational History    Comment: Full Time  Tobacco Use  . Smoking status: Former Smoker    Packs/day: 0.50    Years: 20.00    Pack years: 10.00    Types: Cigarettes    Last attempt to quit: 10/13/2004    Years since quitting: 12.8  . Smokeless tobacco: Never Used  Substance and Sexual Activity  . Alcohol use: No    Comment: rare  . Drug use: No  . Sexual activity: Not on file  Other Topics Concern  . Not on file  Social History Narrative  . Not on file    Vital Signs: Blood pressure 132/76, pulse 79, resp. rate 16, height 5\' 4"  (1.626 m), weight 191 lb 9.6 oz (86.9 kg), SpO2 98 %.  Examination: General Appearance: The patient is well-developed, well-nourished, and in no distress. Skin: Gross inspection of skin unremarkable. Head: normocephalic, no gross deformities. Eyes: no gross deformities noted. ENT: ears appear grossly normal no exudates. Neck: Supple. No thyromegaly. No LAD. Respiratory: no rales no rhonchi. Cardiovascular: Normal S1 and S2 without murmur or rub. Extremities: No cyanosis. pulses are equal. Neurologic: Alert and oriented. No involuntary movements.  LABS: No results found for this or any previous visit (from the past 2160 hour(s)).  Radiology: [facility] Outside [facility] Mammo  Result Date: 04/21/2016 This examination belongs to an outside facility and is stored here for comparison  purposes only.  Contact the originating outside institution for any associated report or interpretation.   No results found.  No results found.    Assessment and Plan: Patient Active Problem List   Diagnosis Date Noted  . Localized, primary osteoarthritis of hand 08/13/2017  . Insomnia 06/12/2017  . Class 1 obesity due to excess calories without serious comorbidity with body mass index (BMI) of 31.0 to 31.9 in adult 05/04/2017  . Former tobacco use 05/04/2017  . Hormone replacement therapy (postmenopausal) 05/04/2017  . Disequilibrium syndrome 02/26/2017  . Hypoglycemia 06/01/2015  . Vasovagal symptom 06/01/2015  . Decreased libido 06/01/2015  . Vasomotor instability 06/01/2015  . Anxiety 12/10/2014  . Acquired hypothyroidism 05/31/2014  . Episodic lightheadedness 08/14/2011    1. Hypersomnia   We will go ahead and schedule her for polysomnogram. Once this is done will discuss the results on then consider CPAP if this is positive.   In  addition she was discussed 2 try to work on exercise and dietary control on weight loss  2. Snoring  likely related to underlying sleep apnea-hypopnea syndrome Will reassess once patient has sleep study  3. Obesity  as noted above we discussed at length exercise and weight loss She is going to try to work on this  4. Anxiety  she will follow up with her primary care   General Counseling: I have discussed the findings of the evaluation and examination with Darshay.  I have also discussed any further diagnostic evaluation thatmay be needed or ordered today. Chancey verbalizes understanding of the findings of todays visit. We also reviewed her medications today and discussed drug interactions and side effects including but not limited excessive drowsiness and altered mental states. We also discussed that there is always a risk not just to her but also people around her. she has been encouraged to call the office with any questions or concerns  that should arise related to todays visit.    Time spent:  I have personally obtained a history, examined the patient, evaluated laboratory and imaging results, formulated the assessment and plan and placed orders.    Yevonne Pax, MD [facility] Pulmonary and Critical Care Sleep medicine

## 2017-08-13 NOTE — Patient Instructions (Signed)

## 2020-01-23 ENCOUNTER — Ambulatory Visit: Payer: BC Managed Care – PPO | Attending: Internal Medicine

## 2020-01-23 DIAGNOSIS — Z23 Encounter for immunization: Secondary | ICD-10-CM

## 2020-01-23 NOTE — Progress Notes (Signed)
   Covid-19 Vaccination Clinic  Name:  Brittany Glover    MRN: [id number] DOB: June 13, 1960  01/23/2020  [name] was observed post Covid-19 immunization for 15 minutes without incident. She was provided with Vaccine Information Sheet and instruction to access the V-Safe system.   [name] was instructed to call 911 with any severe reactions post vaccine: Marland Kitchen Difficulty breathing  . Swelling of face and throat  . A fast heartbeat  . A bad rash all over body  . Dizziness and weakness   Immunizations Administered    Name Date Dose VIS Date Route   Pfizer COVID-19 Vaccine 01/23/2020 10:05 AM 0.3 mL 07/30/2018 Intramuscular   Manufacturer: [employer], [employer]   Lot: [id number]   NDC: [address]-3

## 2020-02-17 ENCOUNTER — Ambulatory Visit: Payer: BC Managed Care – PPO | Attending: Internal Medicine

## 2020-02-17 DIAGNOSIS — Z23 Encounter for immunization: Secondary | ICD-10-CM

## 2020-02-17 NOTE — Progress Notes (Signed)
   Covid-19 Vaccination Clinic  Name:  ARNA LUIS    MRN: [id number] DOB: Sep 12, 1960  02/17/2020  Ms. Garmany was observed post Covid-19 immunization for 15 minutes without incident. She was provided with Vaccine Information Sheet and instruction to access the V-Safe system.   Ms. Guerrier was instructed to call 911 with any severe reactions post vaccine: Marland Kitchen Difficulty breathing  . Swelling of face and throat  . A fast heartbeat  . A bad rash all over body  . Dizziness and weakness   Immunizations Administered    Name Date Dose VIS Date Route   Pfizer COVID-19 Vaccine 02/17/2020  2:27 PM 0.3 mL 07/30/2018 Intramuscular   Manufacturer: [employer], [employer]   Lot: [id number]   NDC: [address]-6
